# Patient Record
Sex: Male | Born: 1960 | Race: White | Hispanic: No | Marital: Married | Smoking: Never smoker
Health system: Southern US, Community
[De-identification: ages and names within clinical notes are randomized; demographics above are authoritative.]

## PROBLEM LIST (undated history)

## (undated) DIAGNOSIS — K219 Gastro-esophageal reflux disease without esophagitis: Secondary | ICD-10-CM

## (undated) DIAGNOSIS — R079 Chest pain, unspecified: Secondary | ICD-10-CM

## (undated) DIAGNOSIS — G4700 Insomnia, unspecified: Secondary | ICD-10-CM

## (undated) DIAGNOSIS — I2510 Atherosclerotic heart disease of native coronary artery without angina pectoris: Secondary | ICD-10-CM

## (undated) DIAGNOSIS — E785 Hyperlipidemia, unspecified: Secondary | ICD-10-CM

## (undated) DIAGNOSIS — Z7982 Long term (current) use of aspirin: Secondary | ICD-10-CM

## (undated) DIAGNOSIS — Z8679 Personal history of other diseases of the circulatory system: Secondary | ICD-10-CM

## (undated) DIAGNOSIS — I209 Angina pectoris, unspecified: Secondary | ICD-10-CM

## (undated) DIAGNOSIS — M109 Gout, unspecified: Secondary | ICD-10-CM

## (undated) DIAGNOSIS — Q245 Malformation of coronary vessels: Secondary | ICD-10-CM

## (undated) DIAGNOSIS — I10 Essential (primary) hypertension: Secondary | ICD-10-CM

## (undated) DIAGNOSIS — K5792 Diverticulitis of intestine, part unspecified, without perforation or abscess without bleeding: Secondary | ICD-10-CM

## (undated) HISTORY — PX: KNEE ARTHROSCOPY: SHX127

## (undated) HISTORY — PX: TONSILLECTOMY: SUR1361

## (undated) HISTORY — PX: HERNIA REPAIR: SHX51

## (undated) HISTORY — PX: CHOLECYSTECTOMY: SHX55

## (undated) HISTORY — PX: VASECTOMY: SHX75

## (undated) HISTORY — PX: WRIST SURGERY: SHX841

---

## 2018-06-27 ENCOUNTER — Other Ambulatory Visit: Payer: Self-pay | Admitting: Orthopedic Surgery

## 2019-12-17 ENCOUNTER — Other Ambulatory Visit: Payer: Self-pay

## 2019-12-17 ENCOUNTER — Emergency Department: Payer: Commercial Managed Care - PPO

## 2019-12-17 ENCOUNTER — Emergency Department
Admission: EM | Admit: 2019-12-17 | Discharge: 2019-12-17 | Disposition: A | Discharge status: Home / Self Care | Payer: Commercial Managed Care - PPO | Attending: Emergency Medicine | Admitting: Emergency Medicine

## 2019-12-17 ENCOUNTER — Encounter: Payer: Self-pay | Admitting: Emergency Medicine

## 2019-12-17 DIAGNOSIS — M79601 Pain in right arm: Secondary | ICD-10-CM | POA: Diagnosis not present

## 2019-12-17 DIAGNOSIS — R0789 Other chest pain: Secondary | ICD-10-CM | POA: Diagnosis present

## 2019-12-17 DIAGNOSIS — R079 Chest pain, unspecified: Secondary | ICD-10-CM

## 2019-12-17 LAB — BASIC METABOLIC PANEL
Anion gap: 9 (ref 5–15)
BUN: 23 mg/dL — ABNORMAL HIGH (ref 6–20)
CO2: 24 mmol/L (ref 22–32)
Calcium: 8.9 mg/dL (ref 8.9–10.3)
Chloride: 106 mmol/L (ref 98–111)
Creatinine, Ser: 1.05 mg/dL (ref 0.61–1.24)
GFR calc Af Amer: >60 mL/min (ref >60)
GFR calc non Af Amer: >60 mL/min (ref >60)
Glucose, Bld: 96 mg/dL (ref 70–99)
Potassium: 3.8 mmol/L (ref 3.5–5.1)
Sodium: 139 mmol/L (ref 135–145)

## 2019-12-17 LAB — TROPONIN I (HIGH SENSITIVITY)
Troponin I (High Sensitivity): 3 ng/L (ref <18)
Troponin I (High Sensitivity): <2 ng/L (ref <18)

## 2019-12-17 LAB — CBC
HCT: 42.9 % (ref 39.0–52.0)
Hemoglobin: 15.3 g/dL (ref 13.0–17.0)
MCH: 30.8 pg (ref 26.0–34.0)
MCHC: 35.7 g/dL (ref 30.0–36.0)
MCV: 86.5 fL (ref 80.0–100.0)
Platelets: 187 10*3/uL (ref 150–400)
RBC: 4.96 MIL/uL (ref 4.22–5.81)
RDW: 12.8 % (ref 11.5–15.5)
WBC: 11.2 10*3/uL — ABNORMAL HIGH (ref 4.0–10.5)
nRBC: 0 % (ref 0.0–0.2)

## 2019-12-17 MED ORDER — KETOROLAC TROMETHAMINE 30 MG/ML IJ SOLN
30.0000 mg | Freq: Once | INTRAMUSCULAR | Status: AC
  Administered 2019-12-17: 30 mg via INTRAMUSCULAR
  Filled 2019-12-17: qty 1

## 2019-12-17 MED ORDER — SODIUM CHLORIDE 0.9% FLUSH: 3.0000 mL | Freq: Once | INTRAVENOUS | Status: DC

## 2019-12-17 NOTE — ED Notes (Signed)
Pt states that he has been having chest pains on and off for months- pt states it is on the right side and sometimes the pain goes down his right arm

## 2019-12-17 NOTE — ED Provider Notes (Signed)
[facility] Emergency Department Provider Note   ____________________________________________   First MD Initiated Contact with Patient 12/17/19 [phone]     (approximate)  I have reviewed the triage vital signs and the nursing notes.   HISTORY  Chief Complaint Chest Pain    HPI Erik Carpenter is a 59 y.o. male with no significant past medical history presents to the ED complaining of chest pain.  Patient reports that he has had intermittent pain in the right side of his chest off and on for the past couple of months.  It can come on at any time, including rest, and is described as sharp and severe.  It will last for about 30 minutes at a time and can radiate down his right arm.  He has not been evaluated for the symptoms previously, but when the symptoms persisted last night much longer than they usually do, he decided to be evaluated.  He denies any fevers, cough, shortness of breath, or swelling in his arms or legs.  He denies any cardiac history and does not take any medications on a regular basis.  He has not had any nausea or vomiting with these episodes.        History reviewed. No pertinent past medical history.  There are no problems to display for this patient.   Past Surgical History:  Procedure Laterality Date  . TONSILLECTOMY    . VASECTOMY      Prior to Admission medications   Not on File    Allergies Oxycodone  History reviewed. No pertinent family history.  Social History Social History   Tobacco Use  . Smoking status: Never Smoker  . Smokeless tobacco: Never Used  Substance Use Topics  . Alcohol use: Not on file  . Drug use: Not on file    Review of Systems  Constitutional: No fever/chills Eyes: No visual changes. ENT: No sore throat. Cardiovascular: Positive for chest pain. Respiratory: Denies shortness of breath. Gastrointestinal: No abdominal pain.  No nausea, no vomiting.  No diarrhea.  No  constipation. Genitourinary: Negative for dysuria. Musculoskeletal: Negative for back pain.  Positive for right arm pain. Skin: Negative for rash. Neurological: Negative for headaches, focal weakness or numbness.  ____________________________________________   PHYSICAL EXAM:  VITAL SIGNS: ED Triage Vitals  Enc Vitals Group     BP 12/17/19 0946 105/72     Pulse Rate 12/17/19 0946 64     Resp 12/17/19 0946 18     Temp 12/17/19 0946 98.7 F (37.1 C)     Temp Source 12/17/19 0946 Oral     SpO2 12/17/19 0946 97 %     Weight 12/17/19 0940 175 lb (79.4 kg)     Height 12/17/19 0940 5\' 7"  (1.702 m)     Head Circumference --      Peak Flow --      Pain Score 12/17/19 0940 2     Pain Loc --      Pain Edu? --      Excl. in GC? --     Constitutional: Alert and oriented. Eyes: Conjunctivae are normal. Head: Atraumatic. Nose: No congestion/rhinnorhea. Mouth/Throat: Mucous membranes are moist. Neck: Normal ROM Cardiovascular: Normal rate, regular rhythm. Grossly normal heart sounds.  2+ radial pulses bilaterally. Respiratory: Normal respiratory effort.  No retractions. Lungs CTAB.  No chest wall tenderness. Gastrointestinal: Soft and nontender. No distention. Genitourinary: deferred Musculoskeletal: No lower extremity tenderness nor edema. Neurologic:  Normal speech and language. No gross focal neurologic  deficits are appreciated. Skin:  Skin is warm, dry and intact. No rash noted. Psychiatric: Mood and affect are normal. Speech and behavior are normal.  ____________________________________________   LABS (all labs ordered are listed, but only abnormal results are displayed)  Labs Reviewed  BASIC METABOLIC PANEL - Abnormal; Notable for the following components:      Result Value   BUN 23 (*)    All other components within normal limits  CBC - Abnormal; Notable for the following components:   WBC 11.2 (*)    All other components within normal limits  TROPONIN I (HIGH  SENSITIVITY)  TROPONIN I (HIGH SENSITIVITY)   ____________________________________________  EKG  ED ECG REPORT I, Blake Divine, the attending physician, personally viewed and interpreted this ECG.   Date: 12/17/2019  EKG Time: 9:44  Rate: 74  Rhythm: normal sinus rhythm  Axis: Normal  Intervals:none  ST&T Change: None   PROCEDURES  Procedure(s) performed (including Critical Care):  Procedures   ____________________________________________   INITIAL IMPRESSION / ASSESSMENT AND PLAN / ED COURSE       59 year old male with no significant past Thelma Barge presents to the ED with intermittent right-sided chest pain for the past couple of months that often radiates down his right arm.  He had a longer lasting episode overnight that has persisted into today with pain primarily present along the medial portion of his right upper arm at this time.  Radial pulses are intact bilaterally and I doubt arterial insufficiency, there is no edema or erythema to suggest DVT or infectious process.  EKG shows no evidence of arrhythmia or ischemia, low suspicion for ACS given atypical symptoms and if 2 sets of troponin are negative he would be appropriate for discharge home with PCP follow-up.  I doubt PE given pain is primarily in his right arm at this time.  2 sets of troponin are negative and patient with some improvement in pain following dose of Toradol.  He is appropriate for discharge home with PCP follow-up, was counseled to return to the ED for new worsening symptoms.  Patient agrees with plan.      ____________________________________________   FINAL CLINICAL IMPRESSION(S) / ED DIAGNOSES  Final diagnoses:  Nonspecific chest pain  Right arm pain     ED Discharge Orders    None       Note:  This document was prepared using Dragon voice recognition software and may include unintentional dictation errors.   Blake Divine, MD 12/17/19 1246

## 2019-12-17 NOTE — ED Triage Notes (Signed)
Pt to ER with c/o right sided chest pain radiating into right arm that started last night.  Pt states has had similar episodes briefly over last month.  Pt describes as aching in chest with pain in arm being sharp.  Pt states nausea, no vomiting.  Pt states SHOB on exertion.

## 2020-11-12 ENCOUNTER — Ambulatory Visit (INDEPENDENT_AMBULATORY_CARE_PROVIDER_SITE_OTHER): Payer: Commercial Managed Care - PPO

## 2020-11-12 ENCOUNTER — Other Ambulatory Visit: Payer: Self-pay

## 2020-11-12 ENCOUNTER — Ambulatory Visit (INDEPENDENT_AMBULATORY_CARE_PROVIDER_SITE_OTHER): Payer: Commercial Managed Care - PPO | Admitting: Podiatry

## 2020-11-12 DIAGNOSIS — M109 Gout, unspecified: Secondary | ICD-10-CM

## 2020-11-12 DIAGNOSIS — M7752 Other enthesopathy of left foot: Secondary | ICD-10-CM

## 2020-11-12 MED ORDER — COLCHICINE 0.6 MG PO TABS: 0.6000 mg | ORAL_TABLET | Freq: Every day | ORAL | 0 refills | Status: DC

## 2020-11-12 MED ORDER — BETAMETHASONE SOD PHOS & ACET 6 (3-3) MG/ML IJ SUSP
3.0000 mg | Freq: Once | INTRAMUSCULAR | Status: AC
  Administered 2020-11-12: 3 mg via INTRA_ARTICULAR

## 2020-11-12 NOTE — Progress Notes (Signed)
   HPI: 60 y.o. male presenting today as a new patient for evaluation of left foot pain.  Patient states that in the past he has been diagnosed with gout.  His last episode of gout was over 1 year ago.  He states that over the past week his left foot has become red and swollen and very painful.  He denies a history of trauma.  Acute onset.  He believes it is suspected gout.  He presents for further treatment and evaluation  No past medical history on file.   Physical Exam: General: The patient is alert and oriented x3 in no acute distress.  Dermatology: Skin is warm, dry and supple bilateral lower extremities. Negative for open lesions or macerations.  Vascular: Palpable pedal pulses bilaterally.  Localized erythema and edema noted to the first MTPJ left. Capillary refill within normal limits.  Neurological: Epicritic and protective threshold grossly intact bilaterally.   Musculoskeletal Exam: Range of motion within normal limits to all pedal and ankle joints bilateral. Muscle strength 5/5 in all groups bilateral.  Erythema with edema and exquisite tenderness to light palpation to the first MTPJ left foot consistent with findings of gout  Radiographic Exam:  Normal osseous mineralization. Joint spaces preserved. No fracture/dislocation/boney destruction.    Assessment: 1.  Acute gout/capsulitis first MTPJ left   Plan of Care:  1. Patient evaluated. X-Rays reviewed.  2.  Injection of 0.5 cc Celestone Soluspan injection the first MTPJ left 3.  Prescription for colchicine 0.6 mg #30 daily 4.  Recommend researching foods to avoid that could exacerbate his gout episodes 5.  Return to clinic as needed      Felecia Shelling, DPM [facility]  Dr. Felecia Shelling, DPM    [address] [place], [place] [address]                Office [phone]  Fax [phone]

## 2021-09-30 IMAGING — CR DG CHEST 2V
1 series · 2 of 2 positions shown · non-contrast
Comparison: None.

CLINICAL DATA: Chest pain

EXAM:
CHEST - 2 VIEW

[Series 1: dg chest 2 view · 0.14mm/px · 2 of 2 slices shown]
[im 1/2]
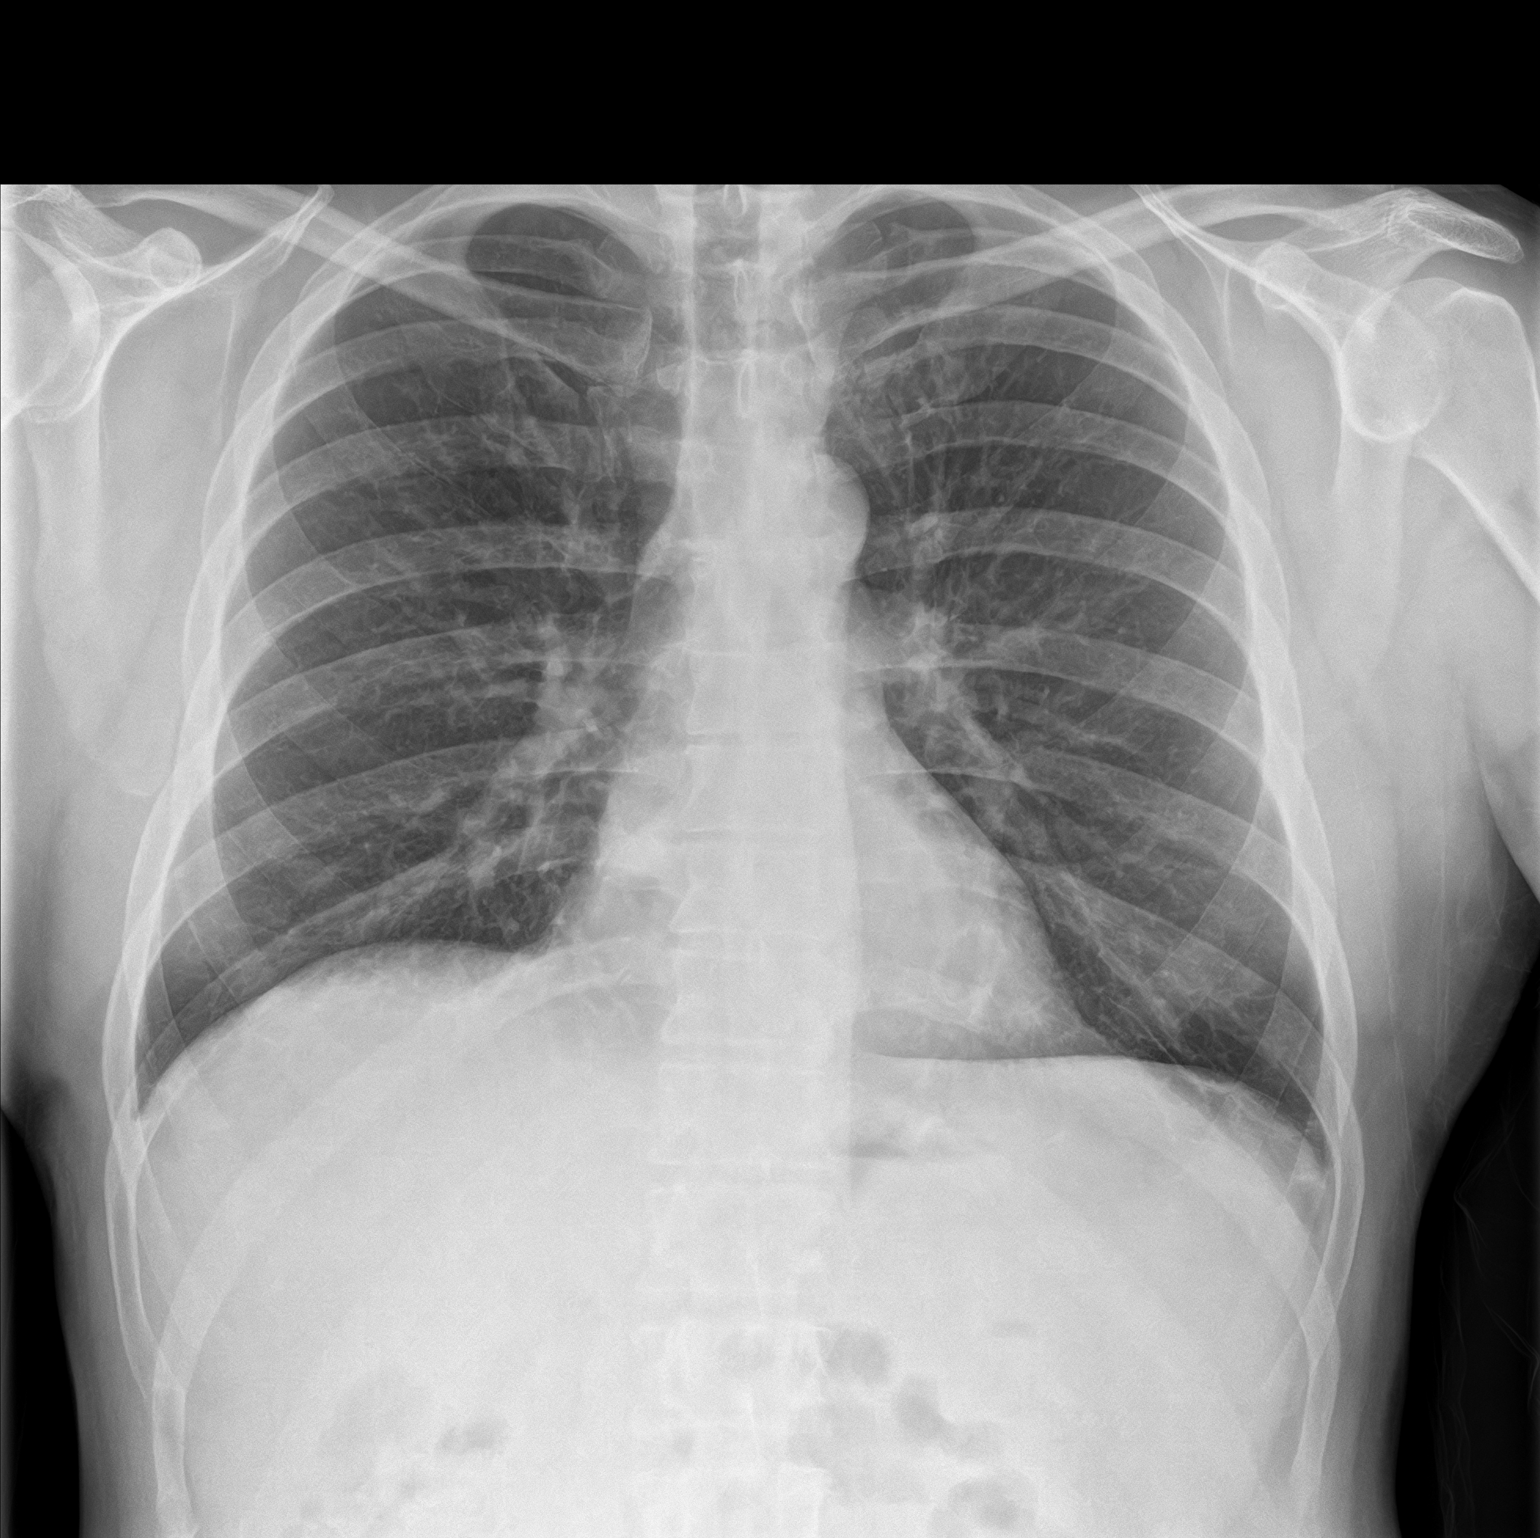
[im 2/2]
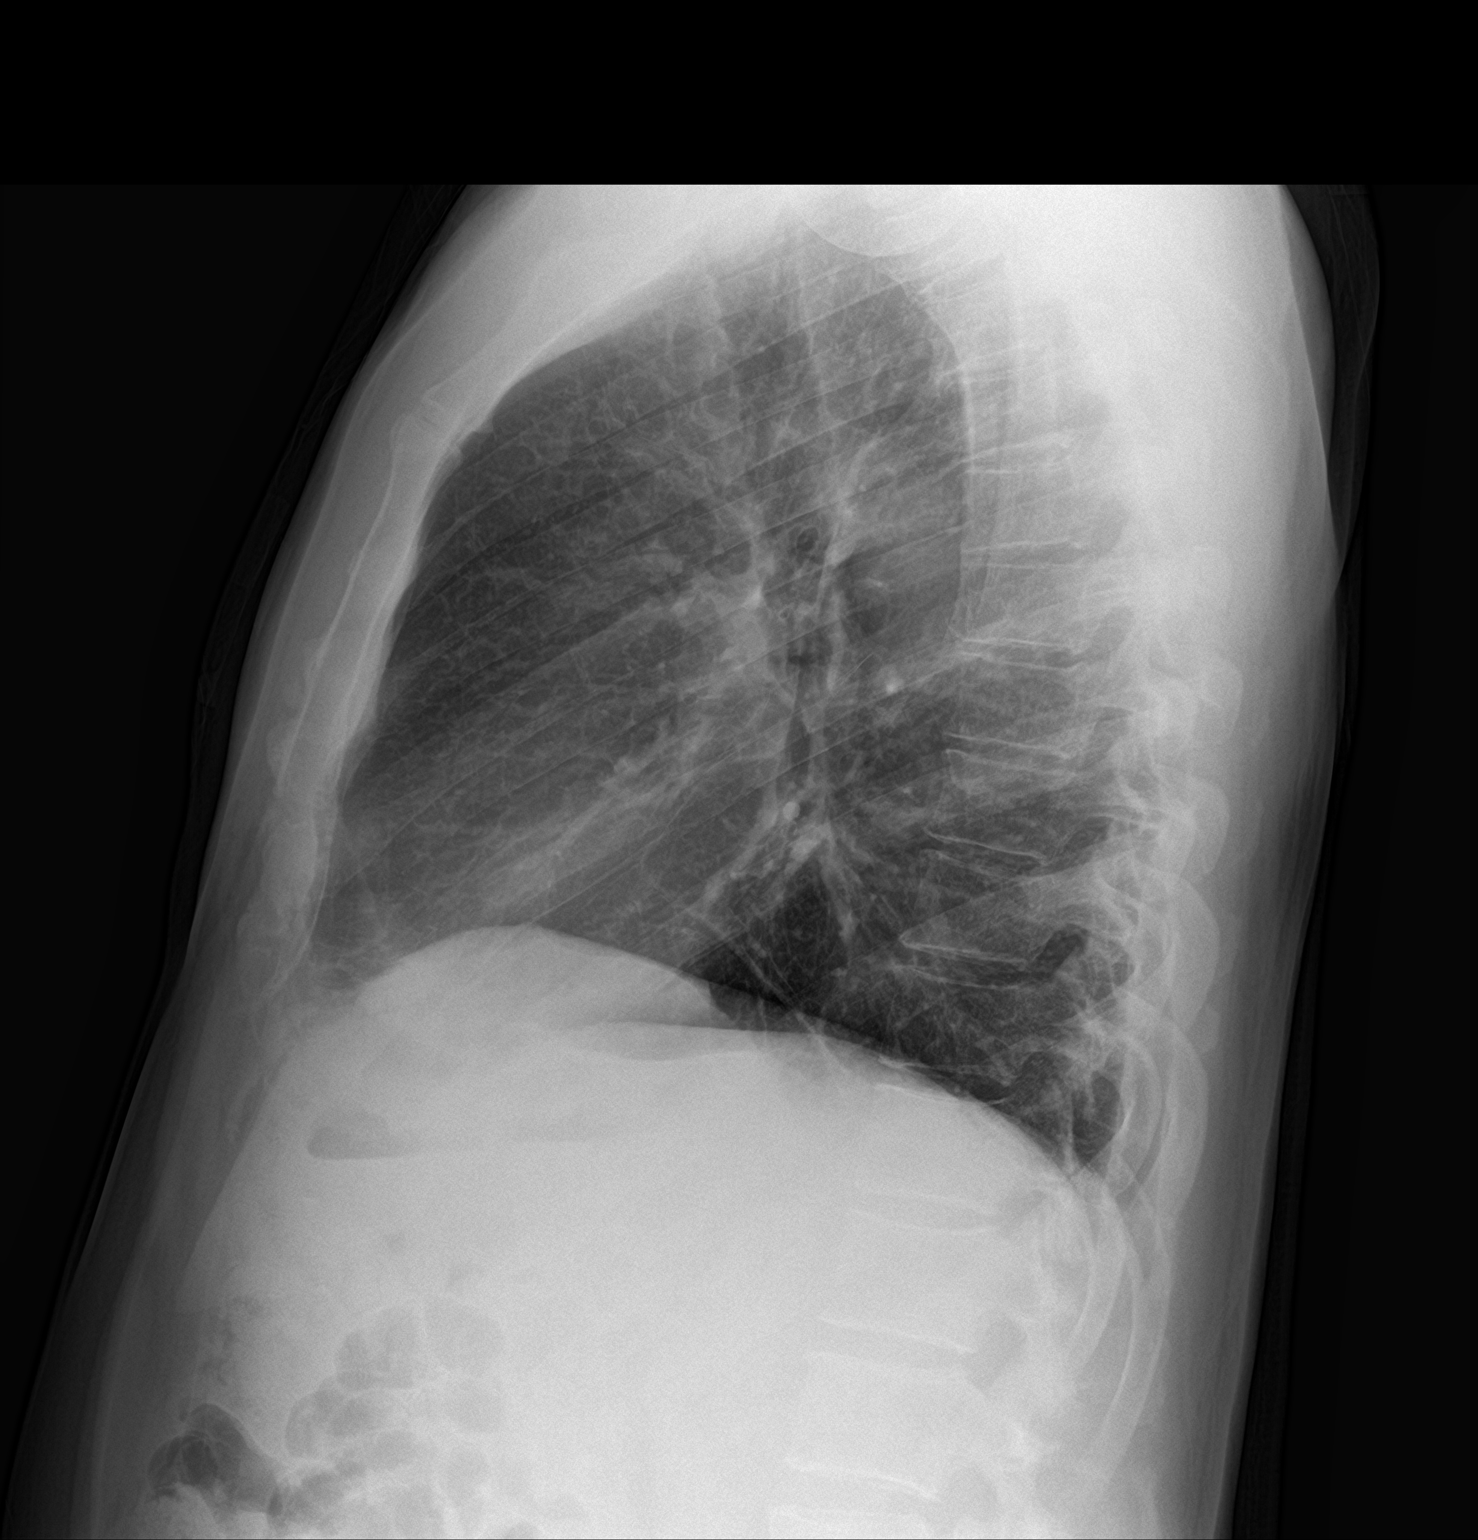

[2 of 2 positions shown; findings below may reference images not displayed]

FINDINGS: Lungs are clear. Heart size and pulmonary vascularity are normal. No
adenopathy. No bone lesions. No pneumothorax.
IMPRESSION: No abnormality noted.

## 2023-08-20 ENCOUNTER — Other Ambulatory Visit: Payer: Self-pay

## 2023-08-20 ENCOUNTER — Emergency Department: Payer: Commercial Managed Care - PPO

## 2023-08-20 ENCOUNTER — Emergency Department
Admission: EM | Admit: 2023-08-20 | Discharge: 2023-08-20 | Disposition: A | Discharge status: Home / Self Care | Payer: Commercial Managed Care - PPO | Attending: Emergency Medicine | Admitting: Emergency Medicine

## 2023-08-20 DIAGNOSIS — R079 Chest pain, unspecified: Secondary | ICD-10-CM

## 2023-08-20 DIAGNOSIS — R0789 Other chest pain: Secondary | ICD-10-CM | POA: Insufficient documentation

## 2023-08-20 LAB — BASIC METABOLIC PANEL
Anion gap: 11 (ref 5–15)
BUN: 23 mg/dL — ABNORMAL HIGH (ref 6–20)
CO2: 25 mmol/L (ref 22–32)
Calcium: 9.6 mg/dL (ref 8.9–10.3)
Chloride: 103 mmol/L (ref 98–111)
Creatinine, Ser: 1.29 mg/dL — ABNORMAL HIGH (ref 0.61–1.24)
GFR, Estimated: >60 mL/min (ref >60)
Glucose, Bld: 92 mg/dL (ref 70–99)
Potassium: 3.9 mmol/L (ref 3.5–5.1)
Sodium: 139 mmol/L (ref 135–145)

## 2023-08-20 LAB — CBC
HCT: 47.5 % (ref 39.0–52.0)
Hemoglobin: 16.1 g/dL (ref 13.0–17.0)
MCH: 30.7 pg (ref 26.0–34.0)
MCHC: 33.9 g/dL (ref 30.0–36.0)
MCV: 90.6 fL (ref 80.0–100.0)
Platelets: 187 10*3/uL (ref 150–400)
RBC: 5.24 MIL/uL (ref 4.22–5.81)
RDW: 12.7 % (ref 11.5–15.5)
WBC: 7.2 10*3/uL (ref 4.0–10.5)
nRBC: 0 % (ref 0.0–0.2)

## 2023-08-20 LAB — TROPONIN I (HIGH SENSITIVITY)
Troponin I (High Sensitivity): 3 ng/L (ref <18)
Troponin I (High Sensitivity): 3 ng/L (ref <18)

## 2023-08-20 MED ORDER — IOHEXOL 350 MG/ML SOLN
75.0000 mL | Freq: Once | INTRAVENOUS | Status: AC | PRN
  Administered 2023-08-20: 75 mL via INTRAVENOUS

## 2023-08-20 NOTE — ED Triage Notes (Signed)
Pt sts that he is having center chest pain that radiates to the right arm and down it. Pt denies any N/V.

## 2023-08-20 NOTE — ED Provider Notes (Signed)
[facility] Provider Note    Event Date/Time   First MD Initiated Contact with Patient 08/20/23 1101     (approximate)   History   Chest Pain   HPI  Erik Carpenter is a 63 y.o. male with no significant past medical history who presents with complaints of chest pain.  Patient reports intermittent chest pain over the last couple of weeks, he reports he went to see his doctor who sent him to the emergency department.  He reports radiation into his right chest at times, no significant shortness of breath although possibly some pleurisy.  No fevers chills or cough.  No calf pain or swelling.     Physical Exam   Triage Vital Signs: ED Triage Vitals  Encounter Vitals Group     BP 08/20/23 1037 119/89     Systolic BP Percentile --      Diastolic BP Percentile --      Pulse Rate 08/20/23 1037 60     Resp 08/20/23 1037 19     Temp 08/20/23 1037 98.1 F (36.7 C)     Temp Source 08/20/23 1037 Oral     SpO2 08/20/23 1037 97 %     Weight 08/20/23 1036 81.6 kg (180 lb)     Height 08/20/23 1036 1.702 m (5\' 7" )     Head Circumference --      Peak Flow --      Pain Score 08/20/23 1036 2     Pain Loc --      Pain Education --      Exclude from Growth Chart --     Most recent vital signs: Vitals:   08/20/23 1037 08/20/23 1600  BP: 119/89 114/67  Pulse: 60 64  Resp: 19 11  Temp: 98.1 F (36.7 C) 98.2 F (36.8 C)  SpO2: 97% 99%     General: Awake, no distress.  CV:  Good peripheral perfusion.  Resp:  Normal effort.  Clear to auscultation bilaterally Abd:  No distention.  Other:  No calf pain or swelling   ED Results / Procedures / Treatments   Labs (all labs ordered are listed, but only abnormal results are displayed) Labs Reviewed  BASIC METABOLIC PANEL - Abnormal; Notable for the following components:      Result Value   BUN 23 (*)    Creatinine, Ser 1.29 (*)    All other components within normal limits  CBC  TROPONIN I (HIGH SENSITIVITY)   TROPONIN I (HIGH SENSITIVITY)     EKG  ED ECG REPORT I, Jene Every, the attending physician, personally viewed and interpreted this ECG.  Date: 08/20/2023  Rhythm: normal sinus rhythm QRS Axis: normal Intervals: normal ST/T Wave abnormalities: normal Narrative Interpretation: no evidence of acute ischemia    RADIOLOGY Chest x-ray viewed interpret by me, no acute abnormality    PROCEDURES:  Critical Care performed:   Procedures   MEDICATIONS ORDERED IN ED: Medications  iohexol (OMNIPAQUE) 350 MG/ML injection 75 mL (75 mLs Intravenous Contrast Given 08/20/23 1333)     IMPRESSION / MDM / ASSESSMENT AND PLAN / ED COURSE  I reviewed the triage vital signs and the nursing notes. Patient's presentation is most consistent with acute presentation with potential threat to life or bodily function.  Patient presents with chest pain as detailed above, differential includes angina less likely ACS given reassuring EKG, PE  High sensitive troponin is reassuring, chest x-ray is unremarkable, will send for CT angiography  Have asked  my colleague to follow-up on CT angiography results, anticipate discharge if reassuring        FINAL CLINICAL IMPRESSION(S) / ED DIAGNOSES   Final diagnoses:  Chest pain, unspecified type     Rx / DC Orders   ED Discharge Orders     None        Note:  This document was prepared using Dragon voice recognition software and may include unintentional dictation errors.   Jene Every, MD 08/21/23 [phone]

## 2023-08-20 NOTE — ED Provider Notes (Signed)
-----------------------------------------   4:20 PM on 08/20/2023 ----------------------------------------- Patient care assumed from Dr. Cyril Loosen.  Patient's workup is reassuring.  Lab work reassuring clued a normal CBC chemistry negative troponin x 2.  Patient CTA has resulted negative for PE no significant acute finding.  Given the patient's reassuring workup we will discharge patient with outpatient follow-up.  Patient agreeable to plan.   Minna Antis, MD 08/20/23 1620

## 2023-08-20 NOTE — ED Notes (Signed)
Patient discharged from ED by provider. Discharge instructions reviewed with patient and all questions answered. Patient ambulatory from ED in NAD.

## 2023-08-20 NOTE — Discharge Instructions (Addendum)
Your workup today in the emergency department is reassuring including a normal CT scan of your chest.  Please follow-up with your doctor the next several days for recheck/reevaluation.

## 2023-09-04 HISTORY — PX: LEFT HEART CATH AND CORONARY ANGIOGRAPHY: CATH118249

## 2024-02-08 ENCOUNTER — Encounter: Payer: Self-pay | Admitting: Surgery

## 2024-02-08 ENCOUNTER — Emergency Department

## 2024-02-08 ENCOUNTER — Inpatient Hospital Stay
Admission: EM | Admit: 2024-02-08 | Discharge: 2024-02-14 | DRG: 392 | Disposition: A | Discharge status: Home / Self Care | Attending: Surgery | Admitting: Surgery

## 2024-02-08 ENCOUNTER — Other Ambulatory Visit: Payer: Self-pay

## 2024-02-08 DIAGNOSIS — M109 Gout, unspecified: Secondary | ICD-10-CM | POA: Diagnosis present

## 2024-02-08 DIAGNOSIS — K5792 Diverticulitis of intestine, part unspecified, without perforation or abscess without bleeding: Principal | ICD-10-CM | POA: Diagnosis present

## 2024-02-08 DIAGNOSIS — Z79899 Other long term (current) drug therapy: Secondary | ICD-10-CM

## 2024-02-08 DIAGNOSIS — M25572 Pain in left ankle and joints of left foot: Secondary | ICD-10-CM | POA: Diagnosis present

## 2024-02-08 DIAGNOSIS — Z9049 Acquired absence of other specified parts of digestive tract: Secondary | ICD-10-CM | POA: Diagnosis not present

## 2024-02-08 DIAGNOSIS — K5720 Diverticulitis of large intestine with perforation and abscess without bleeding: Principal | ICD-10-CM | POA: Diagnosis present

## 2024-02-08 DIAGNOSIS — R339 Retention of urine, unspecified: Secondary | ICD-10-CM | POA: Diagnosis present

## 2024-02-08 DIAGNOSIS — R112 Nausea with vomiting, unspecified: Secondary | ICD-10-CM | POA: Diagnosis present

## 2024-02-08 DIAGNOSIS — Z885 Allergy status to narcotic agent status: Secondary | ICD-10-CM

## 2024-02-08 DIAGNOSIS — R102 Pelvic and perineal pain: Secondary | ICD-10-CM | POA: Diagnosis present

## 2024-02-08 DIAGNOSIS — R7989 Other specified abnormal findings of blood chemistry: Secondary | ICD-10-CM | POA: Diagnosis present

## 2024-02-08 DIAGNOSIS — R3916 Straining to void: Secondary | ICD-10-CM | POA: Diagnosis present

## 2024-02-08 LAB — CBC
HCT: 41.9 % (ref 39.0–52.0)
Hemoglobin: 14.5 g/dL (ref 13.0–17.0)
MCH: 30.7 pg (ref 26.0–34.0)
MCHC: 34.6 g/dL (ref 30.0–36.0)
MCV: 88.8 fL (ref 80.0–100.0)
Platelets: 202 K/uL (ref 150–400)
RBC: 4.72 MIL/uL (ref 4.22–5.81)
RDW: 13.2 % (ref 11.5–15.5)
WBC: 16.5 K/uL — ABNORMAL HIGH (ref 4.0–10.5)
nRBC: 0 % (ref 0.0–0.2)

## 2024-02-08 LAB — RESP PANEL BY RT-PCR (RSV, FLU A&B, COVID)  RVPGX2
Influenza A by PCR: NEGATIVE
Influenza B by PCR: NEGATIVE
Resp Syncytial Virus by PCR: NEGATIVE
SARS Coronavirus 2 by RT PCR: NEGATIVE

## 2024-02-08 LAB — COMPREHENSIVE METABOLIC PANEL WITH GFR
ALT: 38 U/L (ref 0–44)
AST: 40 U/L (ref 15–41)
Albumin: 4 g/dL (ref 3.5–5.0)
Alkaline Phosphatase: 81 U/L (ref 38–126)
Anion gap: 10 (ref 5–15)
BUN: 24 mg/dL — ABNORMAL HIGH (ref 6–20)
CO2: 26 mmol/L (ref 22–32)
Calcium: 9.3 mg/dL (ref 8.9–10.3)
Chloride: 101 mmol/L (ref 98–111)
Creatinine, Ser: 1.54 mg/dL — ABNORMAL HIGH (ref 0.61–1.24)
GFR, Estimated: 51 mL/min — ABNORMAL LOW (ref >60)
Glucose, Bld: 130 mg/dL — ABNORMAL HIGH (ref 70–99)
Potassium: 4.1 mmol/L (ref 3.5–5.1)
Sodium: 137 mmol/L (ref 135–145)
Total Bilirubin: 1.9 mg/dL — ABNORMAL HIGH (ref 0.0–1.2)
Total Protein: 6.9 g/dL (ref 6.5–8.1)

## 2024-02-08 LAB — URINALYSIS, ROUTINE W REFLEX MICROSCOPIC
Bacteria, UA: NONE SEEN
Bilirubin Urine: NEGATIVE
Glucose, UA: NEGATIVE mg/dL
Hgb urine dipstick: NEGATIVE
Ketones, ur: 5 mg/dL — AB
Leukocytes,Ua: NEGATIVE
Nitrite: NEGATIVE
Protein, ur: 30 mg/dL — AB
Specific Gravity, Urine: 1.028 (ref 1.005–1.030)
pH: 6 (ref 5.0–8.0)

## 2024-02-08 LAB — LIPASE, BLOOD: Lipase: 37 U/L (ref 11–51)

## 2024-02-08 LAB — HIV ANTIBODY (ROUTINE TESTING W REFLEX): HIV Screen 4th Generation wRfx: NONREACTIVE

## 2024-02-08 MED ORDER — ONDANSETRON 4 MG PO TBDP: 4.0000 mg | ORAL_TABLET | Freq: Four times a day (QID) | ORAL | Status: DC | PRN

## 2024-02-08 MED ORDER — DILTIAZEM HCL ER 120 MG PO TB24
120.0000 mg | ORAL_TABLET | Freq: Every day | ORAL | Status: DC
  Administered 2024-02-08 – 2024-02-14 (×7): 120 mg via ORAL
  Filled 2024-02-08 (×7): qty 1

## 2024-02-08 MED ORDER — IOHEXOL 300 MG/ML  SOLN
100.0000 mL | Freq: Once | INTRAMUSCULAR | Status: AC | PRN
  Administered 2024-02-08: 100 mL via INTRAVENOUS

## 2024-02-08 MED ORDER — ONDANSETRON HCL 4 MG/2ML IJ SOLN
4.0000 mg | Freq: Four times a day (QID) | INTRAMUSCULAR | Status: DC | PRN
  Administered 2024-02-11: 4 mg via INTRAVENOUS
  Filled 2024-02-08: qty 2

## 2024-02-08 MED ORDER — CIPROFLOXACIN IN D5W 400 MG/200ML IV SOLN
400.0000 mg | Freq: Once | INTRAVENOUS | Status: DC
  Filled 2024-02-08: qty 200

## 2024-02-08 MED ORDER — HYDROCODONE-ACETAMINOPHEN 5-325 MG PO TABS: 1.0000 | ORAL_TABLET | Freq: Three times a day (TID) | ORAL | Status: DC | PRN

## 2024-02-08 MED ORDER — SODIUM CHLORIDE 0.9 % IV SOLN: INTRAVENOUS | Status: AC

## 2024-02-08 MED ORDER — TERBINAFINE HCL 250 MG PO TABS: 250.0000 mg | ORAL_TABLET | Freq: Every day | ORAL | Status: DC

## 2024-02-08 MED ORDER — ATORVASTATIN CALCIUM 20 MG PO TABS
20.0000 mg | ORAL_TABLET | Freq: Every day | ORAL | Status: DC
  Administered 2024-02-08 – 2024-02-13 (×6): 20 mg via ORAL
  Filled 2024-02-08 (×6): qty 1

## 2024-02-08 MED ORDER — METRONIDAZOLE 500 MG/100ML IV SOLN
500.0000 mg | Freq: Once | INTRAVENOUS | Status: AC
  Administered 2024-02-08: 500 mg via INTRAVENOUS
  Filled 2024-02-08: qty 100

## 2024-02-08 MED ORDER — ENOXAPARIN SODIUM 40 MG/0.4ML IJ SOSY
40.0000 mg | PREFILLED_SYRINGE | Freq: Every day | INTRAMUSCULAR | Status: DC
  Administered 2024-02-08 – 2024-02-13 (×6): 40 mg via SUBCUTANEOUS
  Filled 2024-02-08 (×6): qty 0.4

## 2024-02-08 MED ORDER — ONDANSETRON HCL 4 MG/2ML IJ SOLN
4.0000 mg | Freq: Once | INTRAMUSCULAR | Status: AC
  Administered 2024-02-08: 4 mg via INTRAVENOUS
  Filled 2024-02-08: qty 2

## 2024-02-08 MED ORDER — METRONIDAZOLE 500 MG/100ML IV SOLN
500.0000 mg | Freq: Two times a day (BID) | INTRAVENOUS | Status: DC
  Administered 2024-02-08 – 2024-02-14 (×13): 500 mg via INTRAVENOUS
  Filled 2024-02-08 (×12): qty 100

## 2024-02-08 MED ORDER — TRAMADOL HCL 50 MG PO TABS
50.0000 mg | ORAL_TABLET | Freq: Four times a day (QID) | ORAL | Status: DC | PRN
  Administered 2024-02-09 – 2024-02-14 (×5): 50 mg via ORAL
  Filled 2024-02-08 (×4): qty 1

## 2024-02-08 MED ORDER — TRAZODONE HCL 100 MG PO TABS
100.0000 mg | ORAL_TABLET | Freq: Every day | ORAL | Status: DC
  Administered 2024-02-08 – 2024-02-13 (×6): 100 mg via ORAL
  Filled 2024-02-08 (×6): qty 1

## 2024-02-08 MED ORDER — ALLOPURINOL 100 MG PO TABS
200.0000 mg | ORAL_TABLET | Freq: Every day | ORAL | Status: DC
  Administered 2024-02-08 – 2024-02-14 (×8): 200 mg via ORAL
  Filled 2024-02-08 (×7): qty 2

## 2024-02-08 MED ORDER — ACETAMINOPHEN 325 MG PO TABS
650.0000 mg | ORAL_TABLET | Freq: Three times a day (TID) | ORAL | Status: DC | PRN
  Administered 2024-02-08 – 2024-02-10 (×4): 650 mg via ORAL
  Filled 2024-02-08 (×4): qty 2

## 2024-02-08 MED ORDER — DOCUSATE SODIUM 100 MG PO CAPS: 100.0000 mg | ORAL_CAPSULE | Freq: Two times a day (BID) | ORAL | Status: DC | PRN

## 2024-02-08 MED ORDER — CIPROFLOXACIN IN D5W 400 MG/200ML IV SOLN
400.0000 mg | Freq: Two times a day (BID) | INTRAVENOUS | Status: DC
  Administered 2024-02-08 – 2024-02-14 (×13): 400 mg via INTRAVENOUS
  Filled 2024-02-08 (×12): qty 200

## 2024-02-08 MED ORDER — MORPHINE SULFATE (PF) 4 MG/ML IV SOLN
4.0000 mg | Freq: Once | INTRAVENOUS | Status: AC
  Administered 2024-02-08: 4 mg via INTRAVENOUS
  Filled 2024-02-08: qty 1

## 2024-02-08 MED ORDER — ASPIRIN 81 MG PO TBEC: 81.0000 mg | DELAYED_RELEASE_TABLET | Freq: Once | ORAL | Status: DC

## 2024-02-08 MED ORDER — MORPHINE SULFATE (PF) 2 MG/ML IV SOLN
2.0000 mg | INTRAVENOUS | Status: DC | PRN
  Administered 2024-02-08: 2 mg via INTRAVENOUS
  Filled 2024-02-08: qty 1

## 2024-02-08 NOTE — ED Provider Notes (Addendum)
 [facility] Provider Note    Event Date/Time   First MD Initiated Contact with Patient 02/08/24 1018     (approximate)   History   Abdominal Pain   HPI  Erik Carpenter is a 63 y.o. male history of back pain presents emergency department complaining of lower abdominal pain that started overnight..  States been severe.  Last time he was able to urinate without difficulty was at 4 PM yesterday.  States has a lot of swelling and pressure in the lower abdomen and into his testicles and penis.  States he had fever and chills last night.  Severe pain today.  Only gets dribbles of urine out when he tries to urinate.  States had loose stools yesterday.  Couple episodes of vomiting last night      Physical Exam   Triage Vital Signs: ED Triage Vitals  Encounter Vitals Group     BP 02/08/24 0924 99/65     Girls Systolic BP Percentile --      Girls Diastolic BP Percentile --      Boys Systolic BP Percentile --      Boys Diastolic BP Percentile --      Pulse Rate 02/08/24 0924 85     Resp 02/08/24 0924 16     Temp 02/08/24 0924 98.2 F (36.8 C)     Temp Source 02/08/24 0924 Oral     SpO2 02/08/24 0924 99 %     Weight 02/08/24 0925 168 lb (76.2 kg)     Height 02/08/24 0925 5' 7 (1.702 m)     Head Circumference --      Peak Flow --      Pain Score 02/08/24 0925 7     Pain Loc --      Pain Education --      Exclude from Growth Chart --     Most recent vital signs: Vitals:   02/08/24 0924  BP: 99/65  Pulse: 85  Resp: 16  Temp: 98.2 F (36.8 C)  SpO2: 99%     General: Awake, no distress.   CV:  Good peripheral perfusion.  Resp:  Normal effort.  Abd:  No distention.  Abdomen extremely tender at the pubis, super pubis, left lower quadrant and right lower quadrant, bowel sounds are present Other:      ED Results / Procedures / Treatments   Labs (all labs ordered are listed, but only abnormal results are displayed) Labs Reviewed  COMPREHENSIVE  METABOLIC PANEL WITH GFR - Abnormal; Notable for the following components:      Result Value   Glucose, Bld 130 (*)    BUN 24 (*)    Creatinine, Ser 1.54 (*)    Total Bilirubin 1.9 (*)    GFR, Estimated 51 (*)    All other components within normal limits  CBC - Abnormal; Notable for the following components:   WBC 16.5 (*)    All other components within normal limits  URINALYSIS, ROUTINE W REFLEX MICROSCOPIC - Abnormal; Notable for the following components:   Color, Urine AMBER (*)    APPearance CLEAR (*)    Ketones, ur 5 (*)    Protein, ur 30 (*)    All other components within normal limits  RESP PANEL BY RT-PCR (RSV, FLU A&B, COVID)  [id number]  CULTURE, BLOOD (ROUTINE X 2)  CULTURE, BLOOD (ROUTINE X 2)  LIPASE, BLOOD  HIV ANTIBODY (ROUTINE TESTING W REFLEX)     EKG  RADIOLOGY CT abdomen pelvis IV contrast    PROCEDURES:   .Critical Care  Performed by: Gasper Devere ORN, PA-C Authorized by: Gasper Devere ORN, PA-C   Critical care provider statement:    Critical care time (minutes):  30   Critical care time was exclusive of:  Separately billable procedures and treating other patients   Critical care was necessary to treat or prevent imminent or life-threatening deterioration of the following conditions:  Sepsis   Critical care was time spent personally by me on the following activities:  Development of treatment plan with patient or surrogate, discussions with consultants, evaluation of patient's response to treatment, examination of patient, ordering and review of laboratory studies, ordering and review of radiographic studies, ordering and performing treatments and interventions, pulse oximetry, re-evaluation of patient's condition and review of old charts   Critical Care:  no Chief Complaint  Patient presents with   Abdominal Pain      MEDICATIONS ORDERED IN ED: Medications  traZODone  (DESYREL ) tablet 100 mg (has no administration in time range)  aspirin  EC  tablet 81 mg (has no administration in time range)  atorvastatin  (LIPITOR) tablet 20 mg (has no administration in time range)  allopurinol  (ZYLOPRIM ) tablet 200 mg (has no administration in time range)  diltiazem  (DILACOR XR ) 24 hr capsule 120 mg (has no administration in time range)  acetaminophen  (TYLENOL ) tablet 650 mg (has no administration in time range)  traMADol  (ULTRAM ) tablet 50 mg (has no administration in time range)  HYDROcodone -acetaminophen  (NORCO/VICODIN) 5-325 MG per tablet 1 tablet (has no administration in time range)  morphine  (PF) 2 MG/ML injection 2 mg (has no administration in time range)  docusate sodium  (COLACE) capsule 100 mg (has no administration in time range)  ondansetron  (ZOFRAN -ODT) disintegrating tablet 4 mg (has no administration in time range)    Or  ondansetron  (ZOFRAN ) injection 4 mg (has no administration in time range)  enoxaparin  (LOVENOX ) injection 40 mg (has no administration in time range)  0.9 %  sodium chloride  infusion (has no administration in time range)  ciprofloxacin  (CIPRO ) IVPB 400 mg (has no administration in time range)    And  metroNIDAZOLE  (FLAGYL ) IVPB 500 mg (has no administration in time range)  morphine  (PF) 4 MG/ML injection 4 mg (4 mg Intravenous Given 02/08/24 1058)  ondansetron  (ZOFRAN ) injection 4 mg (4 mg Intravenous Given 02/08/24 1058)  iohexol  (OMNIPAQUE ) 300 MG/ML solution 100 mL (100 mLs Intravenous Contrast Given 02/08/24 1116)  metroNIDAZOLE  (FLAGYL ) IVPB 500 mg (0 mg Intravenous Stopped 02/08/24 1429)     IMPRESSION / MDM / ASSESSMENT AND PLAN / ED COURSE  I reviewed the triage vital signs and the nursing notes.                              Differential diagnosis includes, but is not limited to, urinary retention, UTI, pyelonephritis, acute diverticulitis, perforation diverticulum, bowel obstruction, prostatitis, acute appendicitis  Patient's presentation is most consistent with acute illness / injury with system  symptoms.    Medications given: Morphine  4 mg IV, Zofran  4 mg IV  Due to concerns of urinary retention due to the amount of swelling in the lower abdomen, had nursing staff do a bladder scan which only showed like 5 mL.  Instructed them to go ahead and insert Foley due to the patient's history.  We were able to slowly get 50 mL of urine out.  Have concerns that there is some sort  of obstruction  Patient has elevated WBC of 16.5, comprehensive metabolic panel basically reassuring, UA reassuring, to the patient's fever, tachycardia, and WBC elevation do feel he qualifies for sepsis.  CT abdomen pelvis IV contrast, independently reviewed interpreted by me as being positive for sigmoid diverticulitis with abscess  Consult to surgery, Dr. Tye to admit to his service.  Patient is in agreement with admission.  We did go ahead and start Cipro  and Flagyl .  He was admitted in stable condition.      FINAL CLINICAL IMPRESSION(S) / ED DIAGNOSES   Final diagnoses:  Acute diverticulitis     Rx / DC Orders   ED Discharge Orders     None        Note:  This document was prepared using Dragon voice recognition software and may include unintentional dictation errors.    Gasper Devere ORN, PA-C 02/08/24 1444    Gasper Devere ORN, PA-C 02/08/24 1444    Arlander Charleston, MD 02/08/24 [phone]

## 2024-02-08 NOTE — Plan of Care (Signed)
  Problem: Education: Goal: Knowledge of General Education information will improve Description: Including pain rating scale, medication(s)/side effects and non-pharmacologic comfort measures Outcome: Progressing   Problem: Clinical Measurements: Goal: Ability to maintain clinical measurements within normal limits will improve Outcome: Progressing   Problem: Activity: Goal: Risk for activity intolerance will decrease Outcome: Progressing   Problem: Elimination: Goal: Will not experience complications related to bowel motility Outcome: Progressing   Problem: Safety: Goal: Ability to remain free from injury will improve Outcome: Progressing   

## 2024-02-08 NOTE — ED Triage Notes (Signed)
 Pt states that he has been having lower abd pain since last pm, hurts to void, hurts in his penis and his anus, tender to touch

## 2024-02-08 NOTE — H&P (Signed)
 [facility]- General Surgery SURGICAL HISTORY & PHYSICAL   HISTORY OF PRESENT ILLNESS (HPI):  63 y.o. male presented to [facility] ED today for lower abdominal pain.Patient states abdominal pain started abruptly last night radiating to perianal and perineal area. Pain has been constant since last night.  Reports subjective fever and chills. He had a a few nonbloody emesis episodes last night. States he did not try anything to alleviate the pain. Pain noticed the pain is worse with movement and pressure.  Has had a hard time urinating. States he has not had a pain like this before. Has not noticed any changes in bowel habits.  Reports having regular bowel movements, no blood in stool.  With still feeling nauseous this morning but no vomiting. Prior abdominal surgeries include cholecystectomy and hernia repair through the [place].   In the ED, labs indicate leukocytosis with white blood count of 16.5.  Elevated creatinine of 1.54 and elevated total bilirubin of 1.9.  LFTs and alkaline phos were within normal limits.  No electrolyte disturbances noted. Lipase level 37.  CT of abdomen showed mild sigmoid colon diverticulosis with fluid collection measuring 1.1 cm.  Endings consistent with acute complicated diverticulitis.   PAST MEDICAL HISTORY (PMH):  No past medical history on file.  Reviewed. Otherwise negative.   PAST SURGICAL HISTORY (PSH):  Past Surgical History:  Procedure Laterality Date   TONSILLECTOMY     VASECTOMY      Reviewed. Otherwise negative.   MEDICATIONS:  Prior to Admission medications   Medication Sig Start Date End Date Taking? Authorizing Provider  allopurinol  (ZYLOPRIM ) 100 MG tablet Take 200 mg by mouth daily. 12/15/23  Yes [provider]  aspirin  EC 81 MG tablet Take 81 mg by mouth once. 12/15/23  Yes [provider]  atorvastatin  (LIPITOR) 20 MG tablet Take 20 mg by mouth.  TAKE ONE TABLET BY MOUTH AT BEDTIME FOR CHOLESTEROL 12/15/23  Yes [provider]  diltiazem  (DILACOR XR ) 120 MG 24 hr capsule Take 120 mg by mouth.  TAKE ONE CAPSULE BY MOUTH EVERY DAY THIS REPLACES METOPROLOL 12/15/23  Yes [provider]  terbinafine  (LAMISIL ) 250 MG tablet Take 250 mg by mouth.  TAKE ONE TABLET BY MOUTH EVERY DAY 12/15/23  Yes [provider]  traZODone  (DESYREL ) 100 MG tablet Take 100 mg by mouth.  TAKE ONE TABLET BY MOUTH AT BEDTIME FOR SLEEP 11/22/23  Yes [provider]     ALLERGIES:  Allergies  Allergen Reactions   Oxycodone Itching     SOCIAL HISTORY:  Social History   Socioeconomic History   Marital status: Married    Spouse name: Not on file   Number of children: Not on file   Years of education: Not on file   Highest education level: Not on file  Occupational History   Not on file  Tobacco Use   Smoking status: Never   Smokeless tobacco: Never  Substance and Sexual Activity   Alcohol use: Not on file   Drug use: Not on file   Sexual activity: Not on file  Other Topics Concern   Not on file  Social History Narrative   Not on file   Social Drivers of Health   Financial Resource Strain: Not on file  Food Insecurity: Not on file  Transportation Needs: Not on file  Physical Activity: Not on file  Stress: Not on file  Social Connections: Not on file  Intimate Partner Violence: Not on file  FAMILY HISTORY:  No family history on file.  Otherwise negative.   REVIEW OF SYSTEMS:  Review of Systems  Constitutional:  Negative for chills and fever.  Respiratory:  Negative for shortness of breath and wheezing.   Cardiovascular:  Negative for chest pain and palpitations.  Gastrointestinal:  Positive for abdominal pain and nausea. Negative for blood in stool and vomiting.    VITAL SIGNS:  Temp:  [98.2 F (36.8 C)] 98.2 F (36.8 C) (08/05 0924) Pulse Rate:  [85] 85 (08/05 0924) Resp:  [16] 16 (08/05 0924) BP: (99)/(65) 99/65 (08/05 0924) SpO2:  [99 %] 99 % (08/05 0924) Weight:  [76.2 kg]  76.2 kg (08/05 0925)     Height: 5' 7 (170.2 cm) (Simultaneous filing. User may not have seen previous data.) Weight: 76.2 kg (Simultaneous filing. User may not have seen previous data.) BMI (Calculated): 26.31   PHYSICAL EXAM:  Physical Exam Constitutional:      Appearance: He is well-developed.  HENT:     Head: Normocephalic and atraumatic.  Cardiovascular:     Rate and Rhythm: Normal rate and regular rhythm.  Pulmonary:     Effort: Pulmonary effort is normal.     Breath sounds: Normal breath sounds.  Abdominal:     General: Abdomen is flat. There is no distension.     Palpations: Abdomen is soft.     Tenderness: There is generalized abdominal tenderness.  Skin:    General: Skin is warm and dry.  Neurological:     Mental Status: He is alert.     INTAKE/OUTPUT:  This shift: No intake/output data recorded.  Last 2 shifts: @IOLAST2SHIFTS @  Labs:     Latest Ref Rng & Units 02/08/2024    9:25 AM 08/20/2023   10:37 AM 12/17/2019    9:48 AM  CBC  WBC 4.0 - 10.5 K/uL 16.5  7.2  11.2   Hemoglobin 13.0 - 17.0 g/dL 85.4  83.8  84.6   Hematocrit 39.0 - 52.0 % 41.9  47.5  42.9   Platelets 150 - 400 K/uL 202  187  187       Latest Ref Rng & Units 02/08/2024    9:25 AM 08/20/2023   10:37 AM 12/17/2019    9:48 AM  CMP  Glucose 70 - 99 mg/dL 869  92  96   BUN 6 - 20 mg/dL 24  23  23    Creatinine 0.61 - 1.24 mg/dL 8.45  8.70  8.94   Sodium 135 - 145 mmol/L 137  139  139   Potassium 3.5 - 5.1 mmol/L 4.1  3.9  3.8   Chloride 98 - 111 mmol/L 101  103  106   CO2 22 - 32 mmol/L 26  25  24    Calcium  8.9 - 10.3 mg/dL 9.3  9.6  8.9   Total Protein 6.5 - 8.1 g/dL 6.9     Total Bilirubin 0.0 - 1.2 mg/dL 1.9     Alkaline Phos 38 - 126 U/L 81     AST 15 - 41 U/L 40     ALT 0 - 44 U/L 38       Imaging studies:   CLINICAL DATA:  Acute, non localized abdominal pain.   EXAM: CT ABDOMEN AND PELVIS WITH CONTRAST   TECHNIQUE: Multidetector CT imaging of the abdomen and pelvis was  performed using the standard protocol following bolus administration of intravenous contrast.   RADIATION DOSE REDUCTION: This exam was performed according to the departmental dose-optimization  program which includes automated exposure control, adjustment of the mA and/or kV according to patient size and/or use of iterative reconstruction technique.   CONTRAST:  OMNIPAQUE  IOHEXOL  300 MG/ML  SOLN   COMPARISON:  Chest CTA dated 08/20/2023   FINDINGS: Lower chest: Normal-sized heart. Small amount of linear atelectasis/scarring at both lung bases.   Hepatobiliary: No focal liver abnormality is seen. Status post cholecystectomy. No biliary dilatation.   Pancreas: Unremarkable. No pancreatic ductal dilatation or surrounding inflammatory changes.   Spleen: Normal in size without focal abnormality.   Adrenals/Urinary Tract: Small, simple appearing left renal cysts not needing imaging follow-up. Otherwise, unremarkable adrenal glands, kidneys and ureters. Foley catheter in the urinary bladder with no urine in the bladder.   Stomach/Bowel: Mild sigmoid colon diverticulosis. Mild pericolonic soft tissue stranding in the region of the mid and distal sigmoid colon. There is a small, rim enhancing fluid collection containing a small amount of gas along the inferolateral aspect of the sigmoid colon and contiguous with the sigmoid colon. This measures 1.1 cm in maximum diameter. This is adjacent to a diverticulum containing high density material.   Unremarkable stomach, small bowel and appendix.   Vascular/Lymphatic: Atheromatous arterial calcifications without aneurysm. No enlarged lymph nodes.   Reproductive: Prostate is unremarkable.   Other: No abdominal wall hernia or abnormality. No abdominopelvic ascites.   Musculoskeletal: Mild lumbar and lower thoracic spine degenerative changes.   IMPRESSION: Mild sigmoid colon diverticulitis with a 1.1 cm abscess along  the inferolateral aspect of the distal sigmoid colon.     Electronically Signed   By: Elspeth Bathe M.D.   On: 02/08/2024 12:40  Assessment/Plan:  63 y.o. male with acute complicated diverticulitis with abscess measuring 1.1 cm   - Overall patient is stable. Will manage conservatively for now with IV fluids, IV antibiotics, NPO, and pain management.  Based on clinical presentation, plan to repeat imaging in 48 to 72 hours to reassess treatment plan. If abscess is 5 cm or greater then, plan to consult IR for drain. If patient's symptoms worsen, may repeat imaging earlier and consider surgery. Will also be monitoring creatinine levels  - Continue IV Cipro  and Flagyl    - Continue IV fluids and pain management  - DVT prophylaxis  -- Montrail Mehrer Barrientos PA-C

## 2024-02-08 NOTE — Progress Notes (Signed)
   02/08/24 1509  Vitals  Temp 98.9 F (37.2 C)  Temp Source Oral  BP 100/71  MAP (mmHg) 80  BP Location Left Arm  BP Method Automatic  Pulse Rate 70  Pulse Rate Source Monitor  Resp 18  MEWS COLOR  MEWS Score Color Green  Oxygen Therapy  SpO2 90 %  O2 Device Room Air  MEWS Score  MEWS Temp 0  MEWS Systolic 1  MEWS Pulse 0  MEWS RR 0  MEWS LOC 0  MEWS Score 1   Patient admitted from ED alert, stable vitals, and NPO.  Skin assessment completed with CN Bre, pain managed with PRN Morphine  with all safety precautions in place. Plan of care continues.

## 2024-02-09 LAB — CBC
HCT: 36.6 % — ABNORMAL LOW (ref 39.0–52.0)
Hemoglobin: 12.3 g/dL — ABNORMAL LOW (ref 13.0–17.0)
MCH: 31.1 pg (ref 26.0–34.0)
MCHC: 33.6 g/dL (ref 30.0–36.0)
MCV: 92.4 fL (ref 80.0–100.0)
Platelets: 123 K/uL — ABNORMAL LOW (ref 150–400)
RBC: 3.96 MIL/uL — ABNORMAL LOW (ref 4.22–5.81)
RDW: 13.3 % (ref 11.5–15.5)
WBC: 11.4 K/uL — ABNORMAL HIGH (ref 4.0–10.5)
nRBC: 0 % (ref 0.0–0.2)

## 2024-02-09 LAB — BASIC METABOLIC PANEL WITH GFR
Anion gap: 6 (ref 5–15)
BUN: 24 mg/dL — ABNORMAL HIGH (ref 6–20)
CO2: 26 mmol/L (ref 22–32)
Calcium: 8.3 mg/dL — ABNORMAL LOW (ref 8.9–10.3)
Chloride: 105 mmol/L (ref 98–111)
Creatinine, Ser: 1.34 mg/dL — ABNORMAL HIGH (ref 0.61–1.24)
GFR, Estimated: >60 mL/min (ref >60)
Glucose, Bld: 117 mg/dL — ABNORMAL HIGH (ref 70–99)
Potassium: 3.8 mmol/L (ref 3.5–5.1)
Sodium: 137 mmol/L (ref 135–145)

## 2024-02-09 MED ORDER — HYDROMORPHONE HCL 1 MG/ML IJ SOLN
0.5000 mg | INTRAMUSCULAR | Status: DC | PRN
  Administered 2024-02-09 – 2024-02-14 (×5): 0.5 mg via INTRAVENOUS
  Filled 2024-02-09 (×4): qty 0.5

## 2024-02-09 MED ORDER — CHLORHEXIDINE GLUCONATE CLOTH 2 % EX PADS
6.0000 | MEDICATED_PAD | Freq: Every day | CUTANEOUS | Status: DC
  Administered 2024-02-09 – 2024-02-12 (×5): 6 via TOPICAL

## 2024-02-09 MED ORDER — SODIUM CHLORIDE 0.9 % IV BOLUS
1000.0000 mL | Freq: Once | INTRAVENOUS | Status: AC
  Administered 2024-02-09: 1000 mL via INTRAVENOUS

## 2024-02-09 MED ORDER — SODIUM CHLORIDE 0.9 % IV SOLN: INTRAVENOUS | Status: DC

## 2024-02-09 MED ORDER — SIMETHICONE 80 MG PO CHEW
80.0000 mg | CHEWABLE_TABLET | Freq: Four times a day (QID) | ORAL | Status: DC
  Administered 2024-02-10 – 2024-02-14 (×17): 80 mg via ORAL
  Filled 2024-02-09 (×22): qty 1

## 2024-02-09 NOTE — Plan of Care (Signed)
  Problem: Education: Goal: Knowledge of General Education information will improve Description: Including pain rating scale, medication(s)/side effects and non-pharmacologic comfort measures Outcome: Progressing   Problem: Activity: Goal: Risk for activity intolerance will decrease Outcome: Progressing   Problem: Nutrition: Goal: Adequate nutrition will be maintained Outcome: Progressing   Problem: Elimination: Goal: Will not experience complications related to bowel motility Outcome: Progressing   Problem: Pain Managment: Goal: General experience of comfort will improve and/or be controlled Outcome: Progressing   Problem: Safety: Goal: Ability to remain free from injury will improve Outcome: Progressing

## 2024-02-09 NOTE — Progress Notes (Signed)
 [facility]- General Surgery  SURGICAL PROGRESS NOTE  Hospital Day(s): 1.   Interval History:  Patient's blood pressure has been running low.  He states he usually has a normal blood pressure, systolic BP ranging between 110 and 120.  Opioids have been on hold due to low BP, has just been taking Tylenol  for pain. Increased IV fluids to 125 mL/hr. Since then, last BP was 106/63.  Patient states his pain was well-controlled this morning but is now in pain again.   Had not had a bowel movement or passed gas. WBC has improved. Denies any nausea or vomiting.    Vital signs in last 24 hours: [min-max] current  Temp:  [98.2 F (36.8 C)-99.6 F (37.6 C)] 99.6 F (37.6 C) (08/06 1154) Pulse Rate:  [68-77] 74 (08/06 1154) Resp:  [16-18] 16 (08/06 1154) BP: (81-106)/(51-71) 106/63 (08/06 1154) SpO2:  [90 %-95 %] 93 % (08/06 1154)     Height: 5' 7 (170.2 cm) (Simultaneous filing. User may not have seen previous data.) Weight: 76.2 kg (Simultaneous filing. User may not have seen previous data.) BMI (Calculated): 26.31   Intake/Output last 2 shifts:  08/05 0701 - 08/06 0700 In: 2029.5 [P.O.:220; I.V.:409.5; IV Piggyback:1400] Out: 645 [Urine:645]   Physical Exam:  Constitutional: alert, cooperative and no distress  Respiratory: breathing non-labored at rest  Cardiovascular: regular rate and sinus rhythm  Gastrointestinal: soft, TTP mainly on lower abdomen, and non-distended  Labs:     Latest Ref Rng & Units 02/09/2024    4:31 AM 02/08/2024    9:25 AM 08/20/2023   10:37 AM  CBC  WBC 4.0 - 10.5 K/uL 11.4  16.5  7.2   Hemoglobin 13.0 - 17.0 g/dL 87.6  85.4  83.8   Hematocrit 39.0 - 52.0 % 36.6  41.9  47.5   Platelets 150 - 400 K/uL 123  202  187       Latest Ref Rng & Units 02/09/2024    4:31 AM 02/08/2024    9:25 AM 08/20/2023   10:37 AM  CMP  Glucose 70 - 99 mg/dL 882  869  92   BUN 6 - 20 mg/dL 24  24  23    Creatinine 0.61 - 1.24 mg/dL 8.65  8.45  8.70   Sodium 135 - 145 mmol/L 137   137  139   Potassium 3.5 - 5.1 mmol/L 3.8  4.1  3.9   Chloride 98 - 111 mmol/L 105  101  103   CO2 22 - 32 mmol/L 26  26  25    Calcium  8.9 - 10.3 mg/dL 8.3  9.3  9.6   Total Protein 6.5 - 8.1 g/dL  6.9    Total Bilirubin 0.0 - 1.2 mg/dL  1.9    Alkaline Phos 38 - 126 U/L  81    AST 15 - 41 U/L  40    ALT 0 - 44 U/L  38      Imaging studies: No new pertinent imaging studies   Assessment/Plan:  63 y.o. male with acute complicated diverticulitis with abscess measuring 1.1 cm  Patient reporting increase in pain level likely due to pain medications  weaning off, may require stronger pain med than Tylenol . Can give Dilaudid  0.5 mg every 3 hrs as needed moderate pain. Expecting patient's BP to stabilize increasing IV fluids. Will continue to monitor pain level.  Patient is overall stable. No symptoms of acute abdomen.   - WBC has improved 16.5 >> 11.4   - Continue  IV Cipro /[place]   - Plan to repeat CT of abdomen in 24-48 hrs to reassess treatment plan  - Continue NPO, strictly ice chips only  - Started simethicone    - Continue DVT prophylaxis   -- Janny Crute Barrientos PA-C

## 2024-02-10 LAB — CBC
HCT: 35.8 % — ABNORMAL LOW (ref 39.0–52.0)
Hemoglobin: 12.2 g/dL — ABNORMAL LOW (ref 13.0–17.0)
MCH: 31 pg (ref 26.0–34.0)
MCHC: 34.1 g/dL (ref 30.0–36.0)
MCV: 91.1 fL (ref 80.0–100.0)
Platelets: 119 K/uL — ABNORMAL LOW (ref 150–400)
RBC: 3.93 MIL/uL — ABNORMAL LOW (ref 4.22–5.81)
RDW: 13.2 % (ref 11.5–15.5)
WBC: 9.6 K/uL (ref 4.0–10.5)
nRBC: 0 % (ref 0.0–0.2)

## 2024-02-10 LAB — BASIC METABOLIC PANEL WITH GFR
Anion gap: 7 (ref 5–15)
BUN: 18 mg/dL (ref 6–20)
CO2: 22 mmol/L (ref 22–32)
Calcium: 7.9 mg/dL — ABNORMAL LOW (ref 8.9–10.3)
Chloride: 106 mmol/L (ref 98–111)
Creatinine, Ser: 1.02 mg/dL (ref 0.61–1.24)
GFR, Estimated: >60 mL/min (ref >60)
Glucose, Bld: 94 mg/dL (ref 70–99)
Potassium: 3.7 mmol/L (ref 3.5–5.1)
Sodium: 135 mmol/L (ref 135–145)

## 2024-02-10 MED ORDER — SODIUM CHLORIDE 0.9 % IV SOLN: INTRAVENOUS | Status: AC

## 2024-02-10 NOTE — Plan of Care (Addendum)
 AOx4. Elevated temp 100.64F during the night. Tylenol  1x. Temperature decreased to 98.59F with morning VS. IVF infusing, IV Abx administered during the night. Foley patent- pinkish-amber colored. Patient has remained NPO except ice chips and sips of water with med administration. SCDs in place. Patient was able to sleep for a few hours during the night. He did not sleep the previous night. Patient reports pain to lower abdomen that increases with movement but he declined pain medication throughout the night. Patient passed gas and had small smear of BM during the night. CHG administered as well as Foleycare and pericare.  Family at bedside- supportive. Safety measures in place throughout the night.  Problem: Education: Goal: Knowledge of General Education information will improve Description: Including pain rating scale, medication(s)/side effects and non-pharmacologic comfort measures Outcome: Progressing   Problem: Coping: Goal: Level of anxiety will decrease Outcome: Progressing   Problem: Pain Managment: Goal: General experience of comfort will improve and/or be controlled Outcome: Progressing

## 2024-02-10 NOTE — Progress Notes (Signed)
 [facility] General Surgery  SURGICAL PROGRESS NOTE  Hospital Day(s): 2.   Interval History:  Patient seen and examined. No acute events or new complaints overnight.  Patient reports feeling about the same. Pain is still present but overall controlled.  Denies any nausea/vomiting.  Had a bowel movement earlier this morning.   Vital signs in last 24 hours: [min-max] current  Temp:  [98.6 F (37 C)-100.9 F (38.3 C)] 98.7 F (37.1 C) (08/07 0800) Pulse Rate:  [65-84] 76 (08/07 0800) Resp:  [16-19] 16 (08/07 0800) BP: (100-122)/(64-72) 122/72 (08/07 0800) SpO2:  [93 %-97 %] 93 % (08/07 0800)     Height: 5' 7 (170.2 cm) (Simultaneous filing. User may not have seen previous data.) Weight: 76.2 kg (Simultaneous filing. User may not have seen previous data.) BMI (Calculated): 26.31   Intake/Output last 2 shifts:  08/06 0701 - 08/07 0700 In: 3756.2 [P.O.:360; I.V.:2796.2; IV Piggyback:600] Out: 1400 [Urine:1400]   Physical Exam:  Constitutional: alert, cooperative and no distress  Respiratory: breathing non-labored at rest  Cardiovascular: regular rate and sinus rhythm  Gastrointestinal: soft,tender, and non-distended  Labs:     Latest Ref Rng & Units 02/10/2024    5:18 AM 02/09/2024    4:31 AM 02/08/2024    9:25 AM  CBC  WBC 4.0 - 10.5 K/uL 9.6  11.4  16.5   Hemoglobin 13.0 - 17.0 g/dL 87.7  87.6  85.4   Hematocrit 39.0 - 52.0 % 35.8  36.6  41.9   Platelets 150 - 400 K/uL 119  123  202       Latest Ref Rng & Units 02/10/2024    5:18 AM 02/09/2024    4:31 AM 02/08/2024    9:25 AM  CMP  Glucose 70 - 99 mg/dL 94  882  869   BUN 6 - 20 mg/dL 18  24  24    Creatinine 0.61 - 1.24 mg/dL 8.97  8.65  8.45   Sodium 135 - 145 mmol/L 135  137  137   Potassium 3.5 - 5.1 mmol/L 3.7  3.8  4.1   Chloride 98 - 111 mmol/L 106  105  101   CO2 22 - 32 mmol/L 22  26  26    Calcium  8.9 - 10.3 mg/dL 7.9  8.3  9.3   Total Protein 6.5 - 8.1 g/dL   6.9   Total Bilirubin 0.0 - 1.2 mg/dL   1.9    Alkaline Phos 38 - 126 U/L   81   AST 15 - 41 U/L   40   ALT 0 - 44 U/L   38     Imaging studies: No new pertinent imaging studies   Assessment/Plan:  63 y.o. male with acute complicated diverticulitis with abscess measuring 1.1 cm.  Patient is overall doing well.  Still having persistent abdominal pain, not improving.  No signs of acute abdomen.  Leukocytosis has resolved. Planning to repeat CT of abdomen tomorrow.  Due to clinical presentation, it is unlikely abscess is resolving. May consult IR tomorrow if CT of abdomen still shows abscess.  Since labs are improving and patient had return of GI function, will start clear liquid diet. Plan to continue Foley cath until pain improves.   - Continue pain management  - Continue DVT prophylaxis with Lovenox   -- Caris Cerveny Barrientos PA-C

## 2024-02-11 ENCOUNTER — Inpatient Hospital Stay

## 2024-02-11 LAB — CBC
HCT: 35.9 % — ABNORMAL LOW (ref 39.0–52.0)
Hemoglobin: 12.2 g/dL — ABNORMAL LOW (ref 13.0–17.0)
MCH: 31 pg (ref 26.0–34.0)
MCHC: 34 g/dL (ref 30.0–36.0)
MCV: 91.1 fL (ref 80.0–100.0)
Platelets: 148 K/uL — ABNORMAL LOW (ref 150–400)
RBC: 3.94 MIL/uL — ABNORMAL LOW (ref 4.22–5.81)
RDW: 12.9 % (ref 11.5–15.5)
WBC: 8.8 K/uL (ref 4.0–10.5)
nRBC: 0 % (ref 0.0–0.2)

## 2024-02-11 LAB — BASIC METABOLIC PANEL WITH GFR
Anion gap: 8 (ref 5–15)
BUN: 12 mg/dL (ref 6–20)
CO2: 26 mmol/L (ref 22–32)
Calcium: 8.3 mg/dL — ABNORMAL LOW (ref 8.9–10.3)
Chloride: 102 mmol/L (ref 98–111)
Creatinine, Ser: 1.02 mg/dL (ref 0.61–1.24)
GFR, Estimated: >60 mL/min (ref >60)
Glucose, Bld: 114 mg/dL — ABNORMAL HIGH (ref 70–99)
Potassium: 3.9 mmol/L (ref 3.5–5.1)
Sodium: 136 mmol/L (ref 135–145)

## 2024-02-11 MED ORDER — IOHEXOL 300 MG/ML  SOLN
100.0000 mL | Freq: Once | INTRAMUSCULAR | Status: AC | PRN
  Administered 2024-02-11: 100 mL via INTRAVENOUS

## 2024-02-11 MED ORDER — GABAPENTIN 100 MG PO CAPS
100.0000 mg | ORAL_CAPSULE | Freq: Two times a day (BID) | ORAL | Status: DC
  Administered 2024-02-11 – 2024-02-14 (×8): 100 mg via ORAL
  Filled 2024-02-11 (×7): qty 1

## 2024-02-11 MED ORDER — CELECOXIB 100 MG PO CAPS
100.0000 mg | ORAL_CAPSULE | Freq: Every day | ORAL | Status: DC
  Administered 2024-02-11 – 2024-02-14 (×5): 100 mg via ORAL
  Filled 2024-02-11 (×4): qty 1

## 2024-02-11 NOTE — Plan of Care (Signed)
  Problem: Education: Goal: Knowledge of General Education information will improve Description: Including pain rating scale, medication(s)/side effects and non-pharmacologic comfort measures Outcome: Progressing   Problem: Coping: Goal: Level of anxiety will decrease Outcome: Progressing   Problem: Pain Managment: Goal: General experience of comfort will improve and/or be controlled Outcome: Progressing

## 2024-02-11 NOTE — Progress Notes (Addendum)
 [facility]- General Surgery  SURGICAL PROGRESS NOTE  Hospital Day(s): 3.   Interval History:  Patient seen and examined. States he is doing well, still no change in clinical presentation.  Continues to have abdominal pain, mainly on the right lower quadrant radiating to suprapubic area. WBC still within normal limits. States he tolerated clear liquids. Denies nausea or vomiting. Still passing gas and reports having a small bowel movement the night before yesterday. Planning fo for repeat CT imaging this morning. Placed NPO incase we need to consult IR.    Vital signs in last 24 hours: [min-max] current  Temp:  [97.9 F (36.6 C)-100.1 F (37.8 C)] 99.3 F (37.4 C) (08/08 0826) Pulse Rate:  [57-75] 57 (08/08 0826) Resp:  [16-18] 17 (08/08 0826) BP: (108-122)/(71-79) 120/79 (08/08 0826) SpO2:  [92 %-94 %] 94 % (08/08 0826)     Height: 5' 7 (170.2 cm) (Simultaneous filing. User may not have seen previous data.) Weight: 76.2 kg (Simultaneous filing. User may not have seen previous data.) BMI (Calculated): 26.31   Intake/Output last 2 shifts:  08/07 0701 - 08/08 0700 In: 1348.6 [I.V.:1048.6; IV Piggyback:300] Out: 3400 [Urine:3400]   Physical Exam:  Constitutional: alert, cooperative and no distress  Respiratory: breathing non-labored at rest  Cardiovascular: regular rate and sinus rhythm  Gastrointestinal: soft, tender on right lower quadrant and suprapubic, and mildly distended distended  Labs:     Latest Ref Rng & Units 02/11/2024    2:43 AM 02/10/2024    5:18 AM 02/09/2024    4:31 AM  CBC  WBC 4.0 - 10.5 K/uL 8.8  9.6  11.4   Hemoglobin 13.0 - 17.0 g/dL 87.7  87.7  87.6   Hematocrit 39.0 - 52.0 % 35.9  35.8  36.6   Platelets 150 - 400 K/uL 148  119  123       Latest Ref Rng & Units 02/11/2024    2:43 AM 02/10/2024    5:18 AM 02/09/2024    4:31 AM  CMP  Glucose 70 - 99 mg/dL 885  94  882   BUN 6 - 20 mg/dL 12  18  24    Creatinine 0.61 - 1.24 mg/dL 8.97  8.97  8.65   Sodium  135 - 145 mmol/L 136  135  137   Potassium 3.5 - 5.1 mmol/L 3.9  3.7  3.8   Chloride 98 - 111 mmol/L 102  106  105   CO2 22 - 32 mmol/L 26  22  26    Calcium  8.9 - 10.3 mg/dL 8.3  7.9  8.3     Imaging studies:  EXAM: CT ABDOMEN AND PELVIS WITH CONTRAST 02/11/2024 09:14:07 AM   TECHNIQUE: CT of the abdomen and pelvis was performed with the administration of intravenous contrast. Multiplanar reformatted images are provided for review. Automated exposure control, iterative reconstruction, and/or weight based adjustment of the mA/kV was utilized to reduce the radiation dose to as low as reasonably achievable.   COMPARISON: CT of the abdomen and pelvis dated 02/08/2024.   CLINICAL HISTORY: Diverticulitis, complication suspected. Re-eval known divert.   FINDINGS:   LOWER CHEST: There has been interval worsening of streaky vascular opacification/consolidation within the lower lobes bilaterally and there is worsening bilateral dependent atelectasis. There are new small bilateral pleural effusions, slightly larger on the right.   LIVER: The patient is status post cholecystectomy.   GALLBLADDER AND BILE DUCTS: Gallbladder is surgically absent. No biliary ductal dilatation.   SPLEEN: No acute abnormality.  PANCREAS: No acute abnormality.   ADRENAL GLANDS: No acute abnormality.   KIDNEYS, URETERS AND BLADDER: There is a small simple cyst within the lower pole of the left kidney, which does not require follow up. The kidneys are otherwise normal. No stones in the kidneys or ureters. No hydronephrosis. No perinephric or periureteral stranding. A Foley catheter is present.   GI AND BOWEL: There is diffuse abnormal thickening of the wall of the sigmoid colon, which is contracted. There is adjacent soft tissue stranding. There is an inflamed diverticulum with a small diverticular abscess, measuring approximately 2 x 2 x 1 cm.   PERITONEUM AND RETROPERITONEUM: No ascites.  No free air.   VASCULATURE: Aorta is normal in caliber.   LYMPH NODES: No lymphadenopathy.   REPRODUCTIVE ORGANS: No acute abnormality.   BONES AND SOFT TISSUES: No acute osseous abnormality. No focal soft tissue abnormality.   IMPRESSION: 1. Worsening bilateral lower lobe consolidation and dependent atelectasis compared to prior study. New small bilateral pleural effusions, slightly larger on the right. 2. Status post cholecystectomy. 3. Diverticulitis in the sigmoid colon with a small diverticular abscess measuring approximately 2 x 2 x 1 cm.   Electronically signed by: evalene coho 02/11/2024 09:55 AM EDT RP Workstation: [id number]   Assessment/Plan:  63 y.o. male with acute complicated diverticulitis, Day 3.   - Stable vital signs, no fever and not tachycardic  - WBC still within normal limit, but no improvement in abdominal discomfort   - Repeat CT of abdomen shows diverticular abscess measuring about 2 x 2 x 1 cm. Consulted IR - not amenable for drainage. Will add Celebrex  and Gabapentin  for better pain control.    - Continue IV cipro arlette  - Will start clear liquid diet    - Discontinue foley catheter  - Continue pain management and DVT prophylaxis    -- Sukhmani Fetherolf Barrientos PA-C

## 2024-02-11 NOTE — Plan of Care (Signed)
 Brief Interventional Radiology Note:  63 year old male admitted for acute complicated diverticulitis. CT A/P on 8/8 notable for small diverticular abscess measuring approximately 2 x 2 x 1cm. Per chart review patient continues to complain of RLQ abdominal pain radiating to his suprapubic area. WBC 8.8 today down from 16.4 on 8/5. Case and imaging reviewed by Dr. DELENA Balder who does not feel the area is amenable to draining at this time. IR recommending continued antibiotics and pain control. Continue to monitor and consider re-imaging in a few days if pain persists or worsens.  IR will delete consult request.   Please reach out or place new consult if patient status changes and IR can be of any assistance.   Electronically Signed: Delsa Walder M Lacreasha Hinds, PA-C 02/11/2024, 12:08 PM

## 2024-02-12 DIAGNOSIS — K5792 Diverticulitis of intestine, part unspecified, without perforation or abscess without bleeding: Secondary | ICD-10-CM | POA: Diagnosis not present

## 2024-02-12 DIAGNOSIS — M25572 Pain in left ankle and joints of left foot: Secondary | ICD-10-CM

## 2024-02-12 LAB — BASIC METABOLIC PANEL WITH GFR
Anion gap: 10 (ref 5–15)
BUN: 10 mg/dL (ref 6–20)
CO2: 24 mmol/L (ref 22–32)
Calcium: 8.2 mg/dL — ABNORMAL LOW (ref 8.9–10.3)
Chloride: 103 mmol/L (ref 98–111)
Creatinine, Ser: 1.01 mg/dL (ref 0.61–1.24)
GFR, Estimated: >60 mL/min (ref >60)
Glucose, Bld: 109 mg/dL — ABNORMAL HIGH (ref 70–99)
Potassium: 3.5 mmol/L (ref 3.5–5.1)
Sodium: 137 mmol/L (ref 135–145)

## 2024-02-12 LAB — CBC
HCT: 37.1 % — ABNORMAL LOW (ref 39.0–52.0)
Hemoglobin: 12.9 g/dL — ABNORMAL LOW (ref 13.0–17.0)
MCH: 31 pg (ref 26.0–34.0)
MCHC: 34.8 g/dL (ref 30.0–36.0)
MCV: 89.2 fL (ref 80.0–100.0)
Platelets: 162 K/uL (ref 150–400)
RBC: 4.16 MIL/uL — ABNORMAL LOW (ref 4.22–5.81)
RDW: 12.8 % (ref 11.5–15.5)
WBC: 6.4 K/uL (ref 4.0–10.5)
nRBC: 0 % (ref 0.0–0.2)

## 2024-02-12 MED ORDER — KETOROLAC TROMETHAMINE 30 MG/ML IJ SOLN
30.0000 mg | Freq: Four times a day (QID) | INTRAMUSCULAR | Status: DC | PRN
  Administered 2024-02-13 – 2024-02-14 (×3): 30 mg via INTRAVENOUS
  Filled 2024-02-12 (×2): qty 1

## 2024-02-12 NOTE — Progress Notes (Signed)
 General Surgery  SURGICAL PROGRESS NOTE  Hospital Day(s): 4.   Interval History:  Patient seen and examined. States he is doing well, however much more concerned today regarding persistence of his pain that came after voiding and bowel activity.  His pain was suprapubic in location, and lasted for about an hour after his bathroom visit and was quite concerning.  Denies any blood per rectum from rectum, denies any change in urinary habits, no pneumaturia or intermittent stream.  Seems to tolerate clear liquids fine. WBC still within normal limits. Denies nausea or vomiting.  Small diverticular abscess noted on yesterday's CT scan.  Vital signs in last 24 hours: [min-max] current  Temp:  [98.3 F (36.8 C)-98.9 F (37.2 C)] 98.3 F (36.8 C) (08/09 0737) Pulse Rate:  [61-66] 61 (08/09 0737) Resp:  [17-20] 17 (08/09 0737) BP: (116-131)/(72-80) 121/77 (08/09 0737) SpO2:  [93 %-96 %] 95 % (08/09 0737)     Height: 5' 7 (170.2 cm) (Simultaneous filing. User may not have seen previous data.) Weight: 76.2 kg (Simultaneous filing. User may not have seen previous data.) BMI (Calculated): 26.31   Intake/Output last 2 shifts:  08/08 0701 - 08/09 0700 In: 240 [P.O.:240] Out: 2700 [Urine:2700]   Physical Exam:  Constitutional: alert, cooperative and no distress  Respiratory: breathing non-labored at rest  Cardiovascular: regular rate and sinus rhythm  Gastrointestinal: soft, tender on right lower quadrant and suprapubic, and mildly distended distended  Labs:     Latest Ref Rng & Units 02/12/2024    4:05 AM 02/11/2024    2:43 AM 02/10/2024    5:18 AM  CBC  WBC 4.0 - 10.5 K/uL 6.4  8.8  9.6   Hemoglobin 13.0 - 17.0 g/dL 87.0  87.7  87.7   Hematocrit 39.0 - 52.0 % 37.1  35.9  35.8   Platelets 150 - 400 K/uL 162  148  119       Latest Ref Rng & Units 02/12/2024    4:05 AM 02/11/2024    2:43 AM 02/10/2024    5:18 AM  CMP  Glucose 70 - 99 mg/dL 890  885  94   BUN 6 - 20 mg/dL 10  12  18    Creatinine  0.61 - 1.24 mg/dL 8.98  8.97  8.97   Sodium 135 - 145 mmol/L 137  136  135   Potassium 3.5 - 5.1 mmol/L 3.5  3.9  3.7   Chloride 98 - 111 mmol/L 103  102  106   CO2 22 - 32 mmol/L 24  26  22    Calcium  8.9 - 10.3 mg/dL 8.2  8.3  7.9     Imaging studies:  EXAM: CT ABDOMEN AND PELVIS WITH CONTRAST 02/11/2024 09:14:07 AM   TECHNIQUE: CT of the abdomen and pelvis was performed with the administration of intravenous contrast. Multiplanar reformatted images are provided for review. Automated exposure control, iterative reconstruction, and/or weight based adjustment of the mA/kV was utilized to reduce the radiation dose to as low as reasonably achievable.   COMPARISON: CT of the abdomen and pelvis dated 02/08/2024.   CLINICAL HISTORY: Diverticulitis, complication suspected. Re-eval known divert.   FINDINGS:   LOWER CHEST: There has been interval worsening of streaky vascular opacification/consolidation within the lower lobes bilaterally and there is worsening bilateral dependent atelectasis. There are new small bilateral pleural effusions, slightly larger on the right.   LIVER: The patient is status post cholecystectomy.   GALLBLADDER AND BILE DUCTS: Gallbladder is surgically absent. No biliary  ductal dilatation.   SPLEEN: No acute abnormality.   PANCREAS: No acute abnormality.   ADRENAL GLANDS: No acute abnormality.   KIDNEYS, URETERS AND BLADDER: There is a small simple cyst within the lower pole of the left kidney, which does not require follow up. The kidneys are otherwise normal. No stones in the kidneys or ureters. No hydronephrosis. No perinephric or periureteral stranding. A Foley catheter is present.   GI AND BOWEL: There is diffuse abnormal thickening of the wall of the sigmoid colon, which is contracted. There is adjacent soft tissue stranding. There is an inflamed diverticulum with a small diverticular abscess, measuring approximately 2 x 2 x 1 cm.    PERITONEUM AND RETROPERITONEUM: No ascites. No free air.   VASCULATURE: Aorta is normal in caliber.   LYMPH NODES: No lymphadenopathy.   REPRODUCTIVE ORGANS: No acute abnormality.   BONES AND SOFT TISSUES: No acute osseous abnormality. No focal soft tissue abnormality.   IMPRESSION: 1. Worsening bilateral lower lobe consolidation and dependent atelectasis compared to prior study. New small bilateral pleural effusions, slightly larger on the right. 2. Status post cholecystectomy. 3. Diverticulitis in the sigmoid colon with a small diverticular abscess measuring approximately 2 x 2 x 1 cm.   Electronically signed by: evalene coho 02/11/2024 09:55 AM EDT RP Workstation: [id number]   Assessment/Plan:  63 y.o. male with acute complicated diverticulitis, Day 3.   - Stable vital signs, no fever and not tachycardic  - WBC still within normal limit, but increased duration in abdominal discomfort, after voiding and defecating/loose stool.  - Repeat CT of abdomen shows diverticular abscess measuring about 2 x 2 x 1 cm. Consulted IR - not amenable for drainage. Will add Celebrex  and Gabapentin  for better pain control.    - Continue IV cipro /Flagyl .  - Will stay on clear liquid diet for now.  - Continue pain management and DVT prophylaxis   - Complaint of gout-like left ankle pain.  Offered Toradol  for severe pain.    Honor Leghorn, M.D., [facility]  02/12/2024 ; 9:48 AM

## 2024-02-12 NOTE — Plan of Care (Signed)

## 2024-02-12 NOTE — Plan of Care (Signed)
   Problem: Education: Goal: Knowledge of General Education information will improve Description: Including pain rating scale, medication(s)/side effects and non-pharmacologic comfort measures Outcome: Progressing   Problem: Health Behavior/Discharge Planning: Goal: Ability to manage health-related needs will improve Outcome: Progressing   Problem: Clinical Measurements: Goal: Diagnostic test results will improve Outcome: Progressing

## 2024-02-13 DIAGNOSIS — K5792 Diverticulitis of intestine, part unspecified, without perforation or abscess without bleeding: Secondary | ICD-10-CM | POA: Diagnosis not present

## 2024-02-13 LAB — BASIC METABOLIC PANEL WITH GFR
Anion gap: 11 (ref 5–15)
BUN: 10 mg/dL (ref 6–20)
CO2: 28 mmol/L (ref 22–32)
Calcium: 8.6 mg/dL — ABNORMAL LOW (ref 8.9–10.3)
Chloride: 99 mmol/L (ref 98–111)
Creatinine, Ser: 0.99 mg/dL (ref 0.61–1.24)
GFR, Estimated: >60 mL/min (ref >60)
Glucose, Bld: 111 mg/dL — ABNORMAL HIGH (ref 70–99)
Potassium: 3.7 mmol/L (ref 3.5–5.1)
Sodium: 138 mmol/L (ref 135–145)

## 2024-02-13 LAB — CULTURE, BLOOD (ROUTINE X 2)
Culture: NO GROWTH
Culture: NO GROWTH

## 2024-02-13 LAB — CBC
HCT: 37.1 % — ABNORMAL LOW (ref 39.0–52.0)
Hemoglobin: 12.7 g/dL — ABNORMAL LOW (ref 13.0–17.0)
MCH: 31.2 pg (ref 26.0–34.0)
MCHC: 34.2 g/dL (ref 30.0–36.0)
MCV: 91.2 fL (ref 80.0–100.0)
Platelets: 185 K/uL (ref 150–400)
RBC: 4.07 MIL/uL — ABNORMAL LOW (ref 4.22–5.81)
RDW: 12.9 % (ref 11.5–15.5)
WBC: 7.3 K/uL (ref 4.0–10.5)
nRBC: 0 % (ref 0.0–0.2)

## 2024-02-13 MED ORDER — DIPHENHYDRAMINE HCL 25 MG PO CAPS
50.0000 mg | ORAL_CAPSULE | Freq: Four times a day (QID) | ORAL | Status: DC | PRN
  Administered 2024-02-13: 50 mg via ORAL
  Filled 2024-02-13: qty 2

## 2024-02-13 MED ORDER — HYDROCORTISONE 0.5 % EX CREA
TOPICAL_CREAM | Freq: Two times a day (BID) | CUTANEOUS | Status: DC
  Filled 2024-02-13: qty 28.35

## 2024-02-13 NOTE — Progress Notes (Addendum)
 General Surgery  SURGICAL PROGRESS NOTE  Hospital Day(s): 5.   Interval History:  Patient seen and examined. States he is doing well, however much improved today with diminished  pain that comes with voiding and bowel activity.  Denies any blood per rectum, denies any change in urinary habits, no pneumaturia or intermittent stream.  Seems to tolerate clear liquids fine. WBC still within normal limits. Denies nausea or vomiting.  Small diverticular abscess noted on most recent CT scan.  Vital signs in last 24 hours: [min-max] current  Temp:  [98.4 F (36.9 C)-98.8 F (37.1 C)] 98.6 F (37 C) (08/10 0743) Pulse Rate:  [58-66] 58 (08/10 0743) Resp:  [17-18] 17 (08/10 0743) BP: (100-117)/(64-76) 110/74 (08/10 0743) SpO2:  [94 %-98 %] 98 % (08/10 0743)     Height: 5' 7 (170.2 cm) (Simultaneous filing. User may not have seen previous data.) Weight: 76.2 kg (Simultaneous filing. User may not have seen previous data.) BMI (Calculated): 26.31   Intake/Output last 2 shifts:  08/09 0701 - 08/10 0700 In: 530 [P.O.:530] Out: -    Physical Exam:  Constitutional: alert, cooperative and no distress  Respiratory: breathing non-labored at rest  Cardiovascular: regular rate and sinus rhythm  Gastrointestinal: soft, tender on right lower quadrant and suprapubic, and not significantly distended   Labs:     Latest Ref Rng & Units 02/13/2024    5:02 AM 02/12/2024    4:05 AM 02/11/2024    2:43 AM  CBC  WBC 4.0 - 10.5 K/uL 7.3  6.4  8.8   Hemoglobin 13.0 - 17.0 g/dL 87.2  87.0  87.7   Hematocrit 39.0 - 52.0 % 37.1  37.1  35.9   Platelets 150 - 400 K/uL 185  162  148       Latest Ref Rng & Units 02/13/2024    5:02 AM 02/12/2024    4:05 AM 02/11/2024    2:43 AM  CMP  Glucose 70 - 99 mg/dL 888  890  885   BUN 6 - 20 mg/dL 10  10  12    Creatinine 0.61 - 1.24 mg/dL 9.00  8.98  8.97   Sodium 135 - 145 mmol/L 138  137  136   Potassium 3.5 - 5.1 mmol/L 3.7  3.5  3.9   Chloride 98 - 111 mmol/L 99  103  102    CO2 22 - 32 mmol/L 28  24  26    Calcium  8.9 - 10.3 mg/dL 8.6  8.2  8.3     Imaging studies:  EXAM: CT ABDOMEN AND PELVIS WITH CONTRAST 02/11/2024 09:14:07 AM   TECHNIQUE: CT of the abdomen and pelvis was performed with the administration of intravenous contrast. Multiplanar reformatted images are provided for review. Automated exposure control, iterative reconstruction, and/or weight based adjustment of the mA/kV was utilized to reduce the radiation dose to as low as reasonably achievable.   COMPARISON: CT of the abdomen and pelvis dated 02/08/2024.   CLINICAL HISTORY: Diverticulitis, complication suspected. Re-eval known divert.   FINDINGS:   LOWER CHEST: There has been interval worsening of streaky vascular opacification/consolidation within the lower lobes bilaterally and there is worsening bilateral dependent atelectasis. There are new small bilateral pleural effusions, slightly larger on the right.   LIVER: The patient is status post cholecystectomy.   GALLBLADDER AND BILE DUCTS: Gallbladder is surgically absent. No biliary ductal dilatation.   SPLEEN: No acute abnormality.   PANCREAS: No acute abnormality.   ADRENAL GLANDS: No acute abnormality.  KIDNEYS, URETERS AND BLADDER: There is a small simple cyst within the lower pole of the left kidney, which does not require follow up. The kidneys are otherwise normal. No stones in the kidneys or ureters. No hydronephrosis. No perinephric or periureteral stranding. A Foley catheter is present.   GI AND BOWEL: There is diffuse abnormal thickening of the wall of the sigmoid colon, which is contracted. There is adjacent soft tissue stranding. There is an inflamed diverticulum with a small diverticular abscess, measuring approximately 2 x 2 x 1 cm.   PERITONEUM AND RETROPERITONEUM: No ascites. No free air.   VASCULATURE: Aorta is normal in caliber.   LYMPH NODES: No lymphadenopathy.   REPRODUCTIVE  ORGANS: No acute abnormality.   BONES AND SOFT TISSUES: No acute osseous abnormality. No focal soft tissue abnormality.   IMPRESSION: 1. Worsening bilateral lower lobe consolidation and dependent atelectasis compared to prior study. New small bilateral pleural effusions, slightly larger on the right. 2. Status post cholecystectomy. 3. Diverticulitis in the sigmoid colon with a small diverticular abscess measuring approximately 2 x 2 x 1 cm.   Electronically signed by: evalene coho 02/11/2024 09:55 AM EDT RP Workstation: [id number]   Assessment/Plan:  63 y.o. male with acute complicated diverticulitis, Day 3.   - Stable vital signs, no fever and not tachycardic  - WBC still within normal limits  - Last CT of abdomen shows diverticular abscess measuring about 2 x 2 x 1 cm.   - Continue IV cipro /Flagyl .  - Will advance to full liquid diet for now, and observe for improvement in abdominal discomfort  - Continue pain management and DVT prophylaxis   - No mention today of left ankle pain.  I personally spent a total of 25 minutes in the care of the patient today including preparing to see the patient, getting/reviewing separately obtained history, performing a medically appropriate exam/evaluation, placing orders, and documenting clinical information in the EHR.   Honor Leghorn, M.D., [facility]  02/13/2024 ; 10:53 AM

## 2024-02-13 NOTE — Plan of Care (Signed)

## 2024-02-13 NOTE — Plan of Care (Signed)

## 2024-02-14 MED ORDER — METRONIDAZOLE 500 MG PO TABS: 500.0000 mg | ORAL_TABLET | Freq: Three times a day (TID) | ORAL | 0 refills | Status: AC

## 2024-02-14 MED ORDER — CIPROFLOXACIN HCL 500 MG PO TABS: 500.0000 mg | ORAL_TABLET | Freq: Two times a day (BID) | ORAL | 0 refills | Status: AC

## 2024-02-14 NOTE — Discharge Instructions (Signed)
  Diet: Resume home heart healthy regular diet.   Medications: Resume all home medications. For mild to moderate pain: acetaminophen  (Tylenol ) or ibuprofen (if no kidney disease). Combining Tylenol  with alcohol can substantially increase your risk of causing liver disease. Narcotic pain medications, if prescribed, can be used for severe pain, though may cause nausea, constipation, and drowsiness. Do not combine Tylenol  and Norco within a 6 hour period as Norco contains Tylenol . If you do not need the narcotic pain medication, you do not need to fill the prescription. Take antibiotics as instructed.   Call office [phone]) at any time if any questions, worsening pain, fevers/chills, bleeding, drainage from incision site, or other concerns.

## 2024-02-14 NOTE — Plan of Care (Signed)

## 2024-02-14 NOTE — Plan of Care (Signed)

## 2024-02-14 NOTE — Plan of Care (Signed)
  Problem: Education: Goal: Knowledge of General Education information will improve Description: Including pain rating scale, medication(s)/side effects and non-pharmacologic comfort measures 02/14/2024 1329 by Joshua Andrez PARAS, LPN Outcome: Adequate for Discharge 02/14/2024 1115 by Joshua Andrez PARAS, LPN Outcome: Progressing   Problem: Health Behavior/Discharge Planning: Goal: Ability to manage health-related needs will improve 02/14/2024 1329 by Joshua Andrez PARAS, LPN Outcome: Adequate for Discharge 02/14/2024 1115 by Joshua Andrez PARAS, LPN Outcome: Progressing   Problem: Clinical Measurements: Goal: Ability to maintain clinical measurements within normal limits will improve 02/14/2024 1329 by Joshua Andrez PARAS, LPN Outcome: Adequate for Discharge 02/14/2024 1115 by Joshua Andrez PARAS, LPN Outcome: Progressing Goal: Will remain free from infection Outcome: Adequate for Discharge Goal: Diagnostic test results will improve 02/14/2024 1329 by Joshua Andrez PARAS, LPN Outcome: Adequate for Discharge 02/14/2024 1115 by Joshua Andrez PARAS, LPN Outcome: Progressing Goal: Respiratory complications will improve Outcome: Adequate for Discharge Goal: Cardiovascular complication will be avoided Outcome: Adequate for Discharge   Problem: Activity: Goal: Risk for activity intolerance will decrease Outcome: Adequate for Discharge   Problem: Nutrition: Goal: Adequate nutrition will be maintained Outcome: Adequate for Discharge   Problem: Coping: Goal: Level of anxiety will decrease Outcome: Adequate for Discharge   Problem: Elimination: Goal: Will not experience complications related to bowel motility Outcome: Adequate for Discharge Goal: Will not experience complications related to urinary retention Outcome: Adequate for Discharge   Problem: Pain Managment: Goal: General experience of comfort will improve and/or be controlled Outcome: Adequate for Discharge   Problem: Safety: Goal: Ability to remain free  from injury will improve Outcome: Adequate for Discharge   Problem: Skin Integrity: Goal: Risk for impaired skin integrity will decrease Outcome: Adequate for Discharge

## 2024-02-14 NOTE — Discharge Summary (Signed)
 [facility]-General Surgery  SURGICAL DISCHARGE SUMMARY  Patient ID: Erik Carpenter MRN: [id number] DOB/AGE: Aug 22, 1960 63 y.o.  Admit date: 02/08/2024 Discharge date: 02/14/2024  Discharge Diagnoses Patient Active Problem List   Diagnosis Date Noted   Pain of joint of left ankle and foot 02/12/2024   Acute diverticulitis 02/08/2024    Consultants Interventional Radiologist   Procedures None    Hospital Course:  Patient presented to the [facility] ED on 02/08/2024 with lower abdominal pain.  In the ED, labs and imaging were ordered. Labs showed  leukocytosis with a WBC of 16.5.  Elevated creatinine of 1.54 and elevated total bilirubin of 1.9.  LFTs and alkaline phos were within normal limits.  No electrolyte disturbances noted. Lipase level 37.  CT of abdomen showed mild sigmoid colon diverticulosis with fluid collection measuring 1.1 cm. Findings consistent with acute complicated diverticulitis.  Managed conservatively with IV ciprofloxacin /metronidazole , IV fluids and pain management. Patient continued to experience persistent abdominal discomfort. Repeated CT of abdomen/pelvis on 02/11/2024 which showed small diverticular abscess, measuring about  2 x 2 x 1 cm. Consulted IR - abscess not amenable for drainage.  For the last few days, patient's pain has been better controlled. Reports experiencing intermittent suprapubic pain when straining to urinate.  States he did not have this pain prior to this episode of diverticulitis, likely related to inflammation. Patient is now tolerating regular diet post-op and is able to ambulate. Pain is well-controlled.  At this point, patient is cleared from surgical standpoint.  No surgical intervention needed. Will complete the 14-day course of antibiotic outpatient.  Patient will follow-up outpatient with Dr. Tye in one week.     Physical Examination:  Constitutional: alert, in no acute distress Pulmonary: CTA bilaterally, normal breath  sounds Cardiac: regular rate and rhythm Gastrointestinal: soft, non-tender, and non-distended   Allergies as of 02/14/2024       Reactions   Oxycodone Itching        Medication List     TAKE these medications    allopurinol  100 MG tablet Commonly known as: ZYLOPRIM  Take 200 mg by mouth daily.   aspirin  EC 81 MG tablet Take 81 mg by mouth once.   atorvastatin  20 MG tablet Commonly known as: LIPITOR Take 20 mg by mouth.  TAKE ONE TABLET BY MOUTH AT BEDTIME FOR CHOLESTEROL   ciprofloxacin  500 MG tablet Commonly known as: Cipro  Take 1 tablet (500 mg total) by mouth 2 (two) times daily for 8 days.   diltiazem  120 MG 24 hr capsule Commonly known as: DILACOR XR  Take 120 mg by mouth.  TAKE ONE CAPSULE BY MOUTH EVERY DAY THIS REPLACES METOPROLOL   metroNIDAZOLE  500 MG tablet Commonly known as: FLAGYL  Take 1 tablet (500 mg total) by mouth 3 (three) times daily for 8 days.   terbinafine  250 MG tablet Commonly known as: LAMISIL  Take 250 mg by mouth.  TAKE ONE TABLET BY MOUTH EVERY DAY   traZODone  100 MG tablet Commonly known as: DESYREL  Take 100 mg by mouth.  TAKE ONE TABLET BY MOUTH AT BEDTIME FOR SLEEP          Follow-up Information     Tye, Isami, DO Follow up in 1 week(s).   Specialties: General Surgery, Surgery Why: f/u acute complicated diverticulitis (taking abx at home) Contact information: [address] [place] [address] [phone]                  Time spent on discharge management including discussion of hospital  course, clinical condition, outpatient instructions, prescriptions, and follow up with the patient and members of the medical team: >30 minutes  Taner Rzepka Barrientos PA-C

## 2024-02-14 NOTE — Progress Notes (Signed)
 [facility]- General Surgery  SURGICAL PROGRESS NOTE  Hospital Day(s): 6.   Interval History:  Patient seen and examined. Patient reports pain has improved. Still having suprapubic pain when urinating, otherwise denies any pain.  Has a history of urinary hesitancy, and is now straining to urinate. Denies any blood in the urine.  He reports passing gas. Has been tolerating full liquid diet.  Denies any nausea or vomiting.   Vital signs in last 24 hours: [min-max] current  Temp:  [98.4 F (36.9 C)-98.8 F (37.1 C)] 98.6 F (37 C) (08/11 0748) Pulse Rate:  [65-75] 75 (08/11 0748) Resp:  [15-18] 18 (08/11 0748) BP: (98-124)/(66-85) 115/85 (08/11 0748) SpO2:  [95 %-96 %] 95 % (08/11 0748)     Height: 5' 7 (170.2 cm) (Simultaneous filing. User may not have seen previous data.) Weight: 76.2 kg (Simultaneous filing. User may not have seen previous data.) BMI (Calculated): 26.31   Intake/Output last 2 shifts:  08/10 0701 - 08/11 0700 In: 1011.7 [P.O.:712; IV Piggyback:299.7] Out: -    Physical Exam:  Constitutional: alert, cooperative and no distress  Respiratory: breathing non-labored at rest  Cardiovascular: regular rate and sinus rhythm  Gastrointestinal: soft, non-tender, and non-distended   Labs:     Latest Ref Rng & Units 02/13/2024    5:02 AM 02/12/2024    4:05 AM 02/11/2024    2:43 AM  CBC  WBC 4.0 - 10.5 K/uL 7.3  6.4  8.8   Hemoglobin 13.0 - 17.0 g/dL 87.2  87.0  87.7   Hematocrit 39.0 - 52.0 % 37.1  37.1  35.9   Platelets 150 - 400 K/uL 185  162  148       Latest Ref Rng & Units 02/13/2024    5:02 AM 02/12/2024    4:05 AM 02/11/2024    2:43 AM  CMP  Glucose 70 - 99 mg/dL 888  890  885   BUN 6 - 20 mg/dL 10  10  12    Creatinine 0.61 - 1.24 mg/dL 9.00  8.98  8.97   Sodium 135 - 145 mmol/L 138  137  136   Potassium 3.5 - 5.1 mmol/L 3.7  3.5  3.9   Chloride 98 - 111 mmol/L 99  103  102   CO2 22 - 32 mmol/L 28  24  26    Calcium  8.9 - 10.3 mg/dL 8.6  8.2  8.3      Imaging studies: No new pertinent imaging studies   Assessment/Plan:  63 y.o. male with acute complicated diverticulitis, Day 6.    - Stable vital signs  - Significant improvement with pain, denied any abdominal discomfort upon palpation  - Suprapubic pain- nearby fluid collection seen on imaging vs straining. May advise to see a urologist outpatient due to history of urinary hesitancy  - Has been tolerating full liquid diet, will advance to regular diet  - Continue IV Cipro  and Metro  - Continue pain management and DVT prophylaxis  - If patient continues to have no to minimal abdominal discomfort and tolerates regular diet, we will plan to discharge this afternoon or latest by tomorrow morning.   -- Gilmer Cea PA-C

## 2024-02-21 NOTE — Progress Notes (Signed)
 Subjective:   CC: Diverticulitis of large intestine with abscess without bleeding [K57.20]   HPI: History of Present Illness He is a 63 year old male who presents for a follow-up visit for diverticulitis.  He experiences soft bowel movements and has not resumed his regular diet.  Taking it slow on his own. He has no abdominal pain and no urinary symptoms anymore.     Current Medications: has a current medication list which includes the following prescription(s): allopurinol , amoxicillin , aspirin , atorvastatin , ciprofloxacin  hcl, diltiazem , indomethacin, metronidazole , trazodone , and terbinafine  hcl.  Allergies:  Allergies  Allergen Reactions  . Isosorbide Mononitrate Headache  . Oxycodone Anxiety and Itching    ROS: Pertinent positives and negatives noted in HPI    Objective:     Ht 170.2 cm (5' 7)   Wt 76.2 kg (168 lb)   BMI 26.31 kg/m   Constitutional :  Alert, no distress, cooperative  Gastrointestinal: soft, non-tender; bowel sounds normal; no masses,  no organomegaly.   Musculoskeletal: Steady gait and movement  Skin: Cool and moist  Psychiatric: Normal affect, non-agitated, not confused       LABS:  N/A   RADS: N/A  Assessment:      Diverticulitis of large intestine with abscess without bleeding [K57.20] S/p IV abx treatment  Plan:    Clinical exam and history reassuring.  Last cscope one year ago, clear.  Ok to f/u prn at this time.  labs/images/medications/previous chart entries reviewed personally and relevant changes/updates noted above.

## 2024-03-07 ENCOUNTER — Encounter: Payer: Self-pay | Admitting: Emergency Medicine

## 2024-03-07 ENCOUNTER — Ambulatory Visit: Admission: EM | Admit: 2024-03-07 | Discharge: 2024-03-07 | Disposition: A | Discharge status: Home / Self Care

## 2024-03-07 ENCOUNTER — Other Ambulatory Visit: Payer: Self-pay

## 2024-03-07 ENCOUNTER — Emergency Department

## 2024-03-07 ENCOUNTER — Inpatient Hospital Stay
Admission: EM | Admit: 2024-03-07 | Discharge: 2024-03-10 | DRG: 391 | Disposition: A | Discharge status: Home / Self Care | Source: Ambulatory Visit

## 2024-03-07 DIAGNOSIS — K611 Rectal abscess: Secondary | ICD-10-CM | POA: Diagnosis present

## 2024-03-07 DIAGNOSIS — K5720 Diverticulitis of large intestine with perforation and abscess without bleeding: Principal | ICD-10-CM | POA: Diagnosis present

## 2024-03-07 DIAGNOSIS — R748 Abnormal levels of other serum enzymes: Secondary | ICD-10-CM | POA: Insufficient documentation

## 2024-03-07 DIAGNOSIS — K630 Abscess of intestine: Principal | ICD-10-CM

## 2024-03-07 DIAGNOSIS — Z7982 Long term (current) use of aspirin: Secondary | ICD-10-CM | POA: Diagnosis not present

## 2024-03-07 DIAGNOSIS — Z79899 Other long term (current) drug therapy: Secondary | ICD-10-CM | POA: Diagnosis not present

## 2024-03-07 DIAGNOSIS — G4700 Insomnia, unspecified: Secondary | ICD-10-CM | POA: Diagnosis present

## 2024-03-07 DIAGNOSIS — K5792 Diverticulitis of intestine, part unspecified, without perforation or abscess without bleeding: Secondary | ICD-10-CM | POA: Diagnosis present

## 2024-03-07 DIAGNOSIS — K651 Peritoneal abscess: Secondary | ICD-10-CM | POA: Diagnosis present

## 2024-03-07 DIAGNOSIS — Z8719 Personal history of other diseases of the digestive system: Secondary | ICD-10-CM

## 2024-03-07 DIAGNOSIS — Z9049 Acquired absence of other specified parts of digestive tract: Secondary | ICD-10-CM | POA: Diagnosis not present

## 2024-03-07 DIAGNOSIS — E785 Hyperlipidemia, unspecified: Secondary | ICD-10-CM | POA: Diagnosis present

## 2024-03-07 DIAGNOSIS — Z8249 Family history of ischemic heart disease and other diseases of the circulatory system: Secondary | ICD-10-CM

## 2024-03-07 DIAGNOSIS — Z79891 Long term (current) use of opiate analgesic: Secondary | ICD-10-CM

## 2024-03-07 DIAGNOSIS — I700 Atherosclerosis of aorta: Secondary | ICD-10-CM | POA: Diagnosis present

## 2024-03-07 DIAGNOSIS — I10 Essential (primary) hypertension: Secondary | ICD-10-CM | POA: Diagnosis present

## 2024-03-07 DIAGNOSIS — R1030 Lower abdominal pain, unspecified: Secondary | ICD-10-CM | POA: Diagnosis not present

## 2024-03-07 DIAGNOSIS — N179 Acute kidney failure, unspecified: Secondary | ICD-10-CM | POA: Diagnosis present

## 2024-03-07 DIAGNOSIS — M109 Gout, unspecified: Secondary | ICD-10-CM | POA: Diagnosis present

## 2024-03-07 DIAGNOSIS — R7989 Other specified abnormal findings of blood chemistry: Secondary | ICD-10-CM | POA: Insufficient documentation

## 2024-03-07 DIAGNOSIS — K5780 Diverticulitis of intestine, part unspecified, with perforation and abscess without bleeding: Secondary | ICD-10-CM

## 2024-03-07 DIAGNOSIS — R509 Fever, unspecified: Secondary | ICD-10-CM

## 2024-03-07 DIAGNOSIS — Z888 Allergy status to other drugs, medicaments and biological substances status: Secondary | ICD-10-CM

## 2024-03-07 DIAGNOSIS — Z885 Allergy status to narcotic agent status: Secondary | ICD-10-CM

## 2024-03-07 HISTORY — DX: Diverticulitis of intestine, part unspecified, without perforation or abscess without bleeding: K57.92

## 2024-03-07 LAB — COMPREHENSIVE METABOLIC PANEL WITH GFR
ALT: 43 U/L (ref 0–44)
AST: 36 U/L (ref 15–41)
Albumin: 3.7 g/dL (ref 3.5–5.0)
Alkaline Phosphatase: 154 U/L — ABNORMAL HIGH (ref 38–126)
Anion gap: 10 (ref 5–15)
BUN: 20 mg/dL (ref 6–20)
CO2: 26 mmol/L (ref 22–32)
Calcium: 9.2 mg/dL (ref 8.9–10.3)
Chloride: 98 mmol/L (ref 98–111)
Creatinine, Ser: 1.26 mg/dL — ABNORMAL HIGH (ref 0.61–1.24)
GFR, Estimated: >60 mL/min (ref >60)
Glucose, Bld: 107 mg/dL — ABNORMAL HIGH (ref 70–99)
Potassium: 4.1 mmol/L (ref 3.5–5.1)
Sodium: 134 mmol/L — ABNORMAL LOW (ref 135–145)
Total Bilirubin: 1.3 mg/dL — ABNORMAL HIGH (ref 0.0–1.2)
Total Protein: 7.4 g/dL (ref 6.5–8.1)

## 2024-03-07 LAB — URINALYSIS, ROUTINE W REFLEX MICROSCOPIC
Bilirubin Urine: NEGATIVE
Glucose, UA: NEGATIVE mg/dL
Hgb urine dipstick: NEGATIVE
Ketones, ur: NEGATIVE mg/dL
Leukocytes,Ua: NEGATIVE
Nitrite: NEGATIVE
Protein, ur: NEGATIVE mg/dL
Specific Gravity, Urine: 1.033 — ABNORMAL HIGH (ref 1.005–1.030)
pH: 6 (ref 5.0–8.0)

## 2024-03-07 LAB — CBC
HCT: 43.5 % (ref 39.0–52.0)
Hemoglobin: 14.6 g/dL (ref 13.0–17.0)
MCH: 30.4 pg (ref 26.0–34.0)
MCHC: 33.6 g/dL (ref 30.0–36.0)
MCV: 90.6 fL (ref 80.0–100.0)
Platelets: 177 K/uL (ref 150–400)
RBC: 4.8 MIL/uL (ref 4.22–5.81)
RDW: 12.9 % (ref 11.5–15.5)
WBC: 10.1 K/uL (ref 4.0–10.5)
nRBC: 0 % (ref 0.0–0.2)

## 2024-03-07 LAB — LIPASE, BLOOD: Lipase: 39 U/L (ref 11–51)

## 2024-03-07 MED ORDER — IOHEXOL 300 MG/ML  SOLN
100.0000 mL | Freq: Once | INTRAMUSCULAR | Status: AC | PRN
  Administered 2024-03-07: 100 mL via INTRAVENOUS

## 2024-03-07 MED ORDER — ATORVASTATIN CALCIUM 20 MG PO TABS
20.0000 mg | ORAL_TABLET | Freq: Every day | ORAL | Status: DC
  Administered 2024-03-07 – 2024-03-09 (×3): 20 mg via ORAL
  Filled 2024-03-07 (×3): qty 1

## 2024-03-07 MED ORDER — ACETAMINOPHEN 650 MG RE SUPP: 650.0000 mg | Freq: Four times a day (QID) | RECTAL | Status: DC | PRN

## 2024-03-07 MED ORDER — LACTATED RINGERS IV BOLUS (SEPSIS)
1000.0000 mL | Freq: Once | INTRAVENOUS | Status: AC
  Administered 2024-03-07: 1000 mL via INTRAVENOUS

## 2024-03-07 MED ORDER — ONDANSETRON HCL 4 MG PO TABS
4.0000 mg | ORAL_TABLET | Freq: Four times a day (QID) | ORAL | Status: DC | PRN
Start: 2024-03-07 — End: 2024-03-12

## 2024-03-07 MED ORDER — TRAZODONE HCL 100 MG PO TABS: 100.0000 mg | ORAL_TABLET | Freq: Every evening | ORAL | Status: DC | PRN

## 2024-03-07 MED ORDER — SODIUM CHLORIDE 0.9 % IV SOLN
2.0000 g | Freq: Two times a day (BID) | INTRAVENOUS | Status: DC
  Administered 2024-03-07 – 2024-03-08 (×2): 2 g via INTRAVENOUS
  Filled 2024-03-07 (×2): qty 12.5

## 2024-03-07 MED ORDER — ACETAMINOPHEN 325 MG PO TABS
650.0000 mg | ORAL_TABLET | Freq: Four times a day (QID) | ORAL | Status: DC | PRN
Start: 2024-03-07 — End: 2024-03-12
  Administered 2024-03-08 – 2024-03-09 (×3): 650 mg via ORAL
  Filled 2024-03-07 (×3): qty 2

## 2024-03-07 MED ORDER — SODIUM CHLORIDE 0.9 % IV SOLN
2.0000 g | Freq: Once | INTRAVENOUS | Status: DC
  Filled 2024-03-07: qty 12.5

## 2024-03-07 MED ORDER — TRAZODONE HCL 100 MG PO TABS
100.0000 mg | ORAL_TABLET | Freq: Every day | ORAL | Status: DC
  Administered 2024-03-07 – 2024-03-09 (×3): 100 mg via ORAL
  Filled 2024-03-07 (×3): qty 1

## 2024-03-07 MED ORDER — METRONIDAZOLE 500 MG/100ML IV SOLN
500.0000 mg | Freq: Two times a day (BID) | INTRAVENOUS | Status: DC
  Administered 2024-03-08 – 2024-03-10 (×5): 500 mg via INTRAVENOUS
  Filled 2024-03-07 (×6): qty 100

## 2024-03-07 MED ORDER — ALLOPURINOL 100 MG PO TABS
100.0000 mg | ORAL_TABLET | Freq: Every day | ORAL | Status: DC
  Administered 2024-03-07 – 2024-03-09 (×3): 100 mg via ORAL
  Filled 2024-03-07 (×3): qty 1

## 2024-03-07 MED ORDER — HYDRALAZINE HCL 20 MG/ML IJ SOLN: 5.0000 mg | Freq: Four times a day (QID) | INTRAMUSCULAR | Status: DC | PRN

## 2024-03-07 MED ORDER — MORPHINE SULFATE (PF) 2 MG/ML IV SOLN
2.0000 mg | INTRAVENOUS | Status: AC | PRN
  Administered 2024-03-08: 2 mg via INTRAVENOUS
  Filled 2024-03-07: qty 1

## 2024-03-07 MED ORDER — ONDANSETRON HCL 4 MG/2ML IJ SOLN: 4.0000 mg | Freq: Four times a day (QID) | INTRAMUSCULAR | Status: DC | PRN

## 2024-03-07 MED ORDER — LACTATED RINGERS IV SOLN
150.0000 mL/h | INTRAVENOUS | Status: DC
  Administered 2024-03-07 – 2024-03-08 (×3): 150 mL/h via INTRAVENOUS

## 2024-03-07 MED ORDER — DILTIAZEM HCL 60 MG PO TABS: 120.0000 mg | ORAL_TABLET | Freq: Every day | ORAL | Status: DC

## 2024-03-07 MED ORDER — DILTIAZEM HCL 60 MG PO TABS
120.0000 mg | ORAL_TABLET | Freq: Every day | ORAL | Status: DC
  Administered 2024-03-08 – 2024-03-09 (×2): 120 mg via ORAL
  Filled 2024-03-07 (×2): qty 2

## 2024-03-07 MED ORDER — METRONIDAZOLE 500 MG/100ML IV SOLN
500.0000 mg | Freq: Once | INTRAVENOUS | Status: AC
  Administered 2024-03-07: 500 mg via INTRAVENOUS
  Filled 2024-03-07: qty 100

## 2024-03-07 MED ORDER — MORPHINE SULFATE (PF) 4 MG/ML IV SOLN: 4.0000 mg | INTRAVENOUS | Status: AC | PRN

## 2024-03-07 MED ORDER — ACETAMINOPHEN 500 MG PO TABS
1000.0000 mg | ORAL_TABLET | Freq: Once | ORAL | Status: AC
  Administered 2024-03-07: 1000 mg via ORAL
  Filled 2024-03-07: qty 2

## 2024-03-07 NOTE — ED Provider Notes (Signed)
 [facility] Emergency Department Provider Note     Event Date/Time   First MD Initiated Contact with Patient 03/07/24 1343     (approximate)   History   Abdominal Pain   HPI  Wilburn Keir is a 63 y.o. male with a history of diverticulosis, presents to the ED for lower abdominal discomfort x 4 days.  Patient denies any associated nausea, vomiting, or diarrhea.  He localizes pain to the right lower abdominopelvic region.  Patient had recent admission (02/08/24) for nonsurgical management of his acute diverticulitis.  He tolerated the IV medication and was discharged home with p.o. meds.  He had a follow-up visit with Dr. Tye in the clinic 2 weeks ago and had been managing fine.  He returns to the ED today, noting onset since Thursday, of this new right quadrant pain.  He is also endorsing subjective fevers and chills.  Patient denies any anorexia.  He reports his last meaningful bowel movement was probably a week ago.  Since that time he has only had passage of some soft scant stool, despite urge and some rectal pressure.  Physical Exam   Triage Vital Signs: ED Triage Vitals  Encounter Vitals Group     BP 03/07/24 1300 113/73     Girls Systolic BP Percentile --      Girls Diastolic BP Percentile --      Boys Systolic BP Percentile --      Boys Diastolic BP Percentile --      Pulse Rate 03/07/24 1300 91     Resp 03/07/24 1300 17     Temp 03/07/24 1300 98.3 F (36.8 C)     Temp Source 03/07/24 1300 Oral     SpO2 03/07/24 1300 97 %     Weight 03/07/24 1301 162 lb (73.5 kg)     Height 03/07/24 1301 5' 7 (1.702 m)     Head Circumference --      Peak Flow --      Pain Score 03/07/24 1301 5     Pain Loc --      Pain Education --      Exclude from Growth Chart --     Most recent vital signs: Vitals:   03/07/24 1351 03/07/24 1610  BP:    Pulse: 79   Resp:    Temp: 99.1 F (37.3 C) (!) 100.4 F (38 C)  SpO2:      General Awake, no distress.  NAD. chills HEENT NCAT. PERRL. EOMI. No rhinorrhea. Mucous membranes are moist.  CV:  Good peripheral perfusion. RRR RESP:  Normal effort.  CTA ABD:  No distention.  Soft and tender to palpation to the right lower quadrant and the suprapubic region.  Normal rectal tone on DRE.  Gross exam reveals a soft, flat external hemorrhoid.  No evidence of sinus tract, local erythema, pointing, fluctuance.    ED Results / Procedures / Treatments   Labs (all labs ordered are listed, but only abnormal results are displayed) Labs Reviewed  COMPREHENSIVE METABOLIC PANEL WITH GFR - Abnormal; Notable for the following components:      Result Value   Sodium 134 (*)    Glucose, Bld 107 (*)    Creatinine, Ser 1.26 (*)    Alkaline Phosphatase 154 (*)    Total Bilirubin 1.3 (*)    All other components within normal limits  URINALYSIS, ROUTINE W REFLEX MICROSCOPIC - Abnormal; Notable for the following components:   Color, Urine YELLOW (*)  APPearance CLEAR (*)    Specific Gravity, Urine 1.033 (*)    All other components within normal limits  LIPASE, BLOOD  CBC   EKG   RADIOLOGY  I personally viewed and evaluated these images as part of my medical decision making, as well as reviewing the written report by the radiologist.  ED Provider Interpretation: Upper rectal abscess + rectosigmoid abscess likely communicating  CT ABDOMEN PELVIS W CONTRAST Result Date: 03/07/2024 CLINICAL DATA:  The lower abdominal pain with fevers. History of diverticulitis small diverticular abscess shown on CT 02/11/2024 EXAM: CT ABDOMEN AND PELVIS WITH CONTRAST TECHNIQUE: Multidetector CT imaging of the abdomen and pelvis was performed using the standard protocol following bolus administration of intravenous contrast. RADIATION DOSE REDUCTION: This exam was performed according to the departmental dose-optimization program which includes automated exposure control, adjustment of the mA and/or kV according to patient size and/or  use of iterative reconstruction technique. CONTRAST:  OMNIPAQUE  IOHEXOL  300 MG/ML  SOLN COMPARISON:  02/11/2024 FINDINGS: Lower chest: Volume loss along the right hemidiaphragm compatible with atelectasis or scarring. The degree of atelectasis is reduced compared 02/11/2024. Mild scarring or atelectasis posteriorly in the left lower lobe. Hepatobiliary: Cholecystectomy. Pancreas: Unremarkable Spleen: Unremarkable Adrenals/Urinary Tract: Left mid to lower kidney benign cyst unchanged. Otherwise unremarkable. Stomach/Bowel: Dominant partially intramural abscess of the upper rectum 5.9 by 3.9 by 3.7 cm (volume = 45 cm^3) on image 74 series 2, formerly along the serosal margin and formerly 5.6 by 2.1 by 3.3 cm (volume = 20 cm^3) on 02/11/2024. This likely connects to a more proximal abscess measuring 2.1 by 1.2 by 3.4 cm (volume = 4.5 cm^3) along the rectosigmoid junction on image 67 series 2. This previously measured 2.0 by 1.5 by 1.2 cm (volume = 1.9 cm^3). Vascular/Lymphatic: Atherosclerosis is present, including aortoiliac atherosclerotic disease. Reproductive: Unremarkable Other: No supplemental non-categorized findings. Musculoskeletal: Schmorl's nodes along the superior endplates at L1 and L2. IMPRESSION: 1. Likely connecting abscesses in the perirectal regions/rectosigmoid junction have just over doubled in volume compared to 02/11/2024. 2. Aortic Atherosclerosis (ICD10-I70.0). Electronically Signed   By: Ryan Salvage M.D.   On: 03/07/2024 16:53    PROCEDURES:  Critical Care performed: No  Procedures   MEDICATIONS ORDERED IN ED: Medications  metroNIDAZOLE  (FLAGYL ) IVPB 500 mg (has no administration in time range)  ceFEPIme  (MAXIPIME ) 2 g in sodium chloride  0.9 % 100 mL IVPB (has no administration in time range)  iohexol  (OMNIPAQUE ) 300 MG/ML solution 100 mL (100 mLs Intravenous Contrast Given 03/07/24 1510)  acetaminophen  (TYLENOL ) tablet 1,000 mg (1,000 mg Oral Given 03/07/24 1612)      IMPRESSION / MDM / ASSESSMENT AND PLAN / ED COURSE  I reviewed the triage vital signs and the nursing notes.                              Differential diagnosis includes, but is not limited to, acute appendicitis, renal colic, testicular torsion, urinary tract infection/pyelonephritis, prostatitis,  epididymitis, diverticulitis, small bowel obstruction or ileus, colitis, abdominal aortic aneurysm, gastroenteritis, hernia, etc.  Patient's presentation is most consistent with acute complicated illness / injury requiring diagnostic workup.  ----------------------------------------- 5:29 PM on 03/07/2024 ----------------------------------------- Dr. Jordis at bedside to evaluate patient.  Hospitalist has been consulted for admission.  ----------------------------------------- 5:52 PM on 03/07/2024 ----------------------------------------- Spoke with Dr. Greig Free (hospitalist): She will admit the patient to the hospital service as expressed.  Surgical consult has been placed and she  will continue medical management pending surgical versus IR intervention.  Patient's diagnosis is consistent with acute rectal sigmoid and proximal rectal abscesses likely secondary to recent diverticulitis.  Patient presents in no acute distress, with subjective fevers and chills.  Vital signs are stable except he noted to have uptrending temperature in the ED.  Exam showed some tenderness to the lower abdominal pelvic region.  Rectal exam was otherwise benign.  CT imaging interpreted by me, shows 2 progressively enlarged abscesses when compared to CT scan from 2 weeks prior.  General surgery was consulted, and advised patient to be admitted to the hospital service for medical management.  Patient and his family member at bedside are made aware of the CT report findings, and the recommendation for admission and ongoing medical management.  Patient is agreeable to the plan at this time.   Clinical Course as of  03/07/24 1737  Tue Mar 07, 2024  1719 CT exam shows 2 distinct abscesses, likely communicating, between the upper rectum and the rectosigmoid junction, respectively. [JM]  1730 Dr. Jordis has been consulted regarding the case.  He is at bedside to evaluate the patient.  Hospitalist has been consulted. [JM]    Clinical Course User Index [JM] Teirra Carapia, Candida LULLA Kings, PA-C    FINAL CLINICAL IMPRESSION(S) / ED DIAGNOSES   Final diagnoses:  Abscess of sigmoid colon     Rx / DC Orders   ED Discharge Orders     None        Note:  This document was prepared using Dragon voice recognition software and may include unintentional dictation errors.    Loyd Candida LULLA Kings, PA-C 03/07/24 1753    Jacolyn Pae, MD 03/08/24 [phone]

## 2024-03-07 NOTE — Hospital Course (Signed)
 Erik Carpenter is a 63 year old male with history of hyperlipidemia, insomnia, who presents emergency department for chief concerns of abdominal pain, fever.  Vitals in the ED showed t 100.4, rr of 17, heart rate 79, blood pressure 113/73, SpO2 97% on room air.  Serum sodium is 134, potassium 4.1, chloride 98, bicarb 26, BUN of 20, serum creatinine of 1.26, EGFR greater than 60, nonfasting blood glucose 107, WBC 10.1, hemoglobin 14.6, platelets of 177.  UA was negative for leukocytes and nitrates.  ED treatment: Metronidazole , cefepime , azithromycin 1000 mg p.o. one-time dose

## 2024-03-07 NOTE — Assessment & Plan Note (Signed)
 With abscess General Surgery has been consulted and recommends admission for IV antibiotic and IR consultation IR has been consulted for aspiration collection and possible drain placement Symptomatic support: Morphine  2 mg IV every 4 hours as needed for moderate pain, morphine  4 mg IV every 4 hours as needed for severe pain, 1 day ordered LR 1 L bolus ordered on admission Continue with LR infusion at 150 mL/h, 20 hours ordered

## 2024-03-07 NOTE — Discharge Instructions (Signed)
 Go to the emergency department for evaluation of your abdominal pain and fever with your recent history of hospitalization for acute diverticulitis.

## 2024-03-07 NOTE — Consult Note (Addendum)
 Pharmacy Antibiotic Note  Caeden Foots is a 63 y.o. male admitted on 03/07/2024 with rectosigmoid abscess.  Pharmacy has been consulted for Cefepime  dosing.  Plan: The dose of Cefepime  will be adjusted to q12h based on renal function.  Height: 5' 7 (170.2 cm) Weight: 73.5 kg (162 lb) IBW/kg (Calculated) : 66.1  Temp (24hrs), Avg:99.1 F (37.3 C), Min:98.3 F (36.8 C), Max:100.4 F (38 C)  Recent Labs  Lab 03/07/24 1303  WBC 10.1  CREATININE 1.26*    Estimated Creatinine Clearance: 58.3 mL/min (A) (by C-G formula based on SCr of 1.26 mg/dL (H)).    Allergies  Allergen Reactions   Oxycodone Itching    Antimicrobials this admission: 0902 Metronidazole  500mg  q12h >>  0902 Cefepime  2G q12h  >>   Microbiology results: 0902 BCx: pending  Thank you for allowing pharmacy to be a part of this patient's care.  Annabella LOISE Banks 03/07/2024 6:00 PM

## 2024-03-07 NOTE — Assessment & Plan Note (Signed)
 Home allopurinol  100 mg nightly resumed

## 2024-03-07 NOTE — Assessment & Plan Note (Signed)
Home atorvastatin 20 mg nightly resumed

## 2024-03-07 NOTE — Consult Note (Signed)
 Chief Complaint: Patient was seen in consultation today for acute diverticulitis with abscess formation, with consideration for drainage.  Referring Provider(s): Dr. Greig Free, MD  Supervising Physician: Philip Cornet  Patient Status: [facility] - Out-pt  Patient is Full Code  History of Present Illness: Erik Carpenter is a 63 y.o. male  with PMHx notable for diverticulitis, HLD, and insomnia.  Per Dr. Stacy H&P on 9/2: [...]Acute diverticulitis With abscess General Surgery has been consulted and recommends admission for IV antibiotic and IR consultation IR has been consulted for aspiration collection and possible drain placement Symptomatic support: Morphine  2 mg IV every 4 hours as needed for moderate pain, morphine  4 mg IV every 4 hours as needed for severe pain, 1 day ordered LR 1 L bolus ordered on admission Continue with LR infusion at 150 mL/h, 20 hours ordered   Interventional Radiology was requested for diverticular abscess aspiration and possible drainage. Request was reviewed and approved by Dr. Philip. Patient is tentatively scheduled for same in IR today.   Patient is alert and laying in bed, calm. His daughter a family friend are at the bedside. Patient is currently complaining of mild lower abdominal pain. Patient denies any fevers, headache, chest pain, SOB, cough, nausea, vomiting or bleeding.    Past Medical History:  Diagnosis Date   Diverticulitis     Past Surgical History:  Procedure Laterality Date   TONSILLECTOMY     VASECTOMY      Allergies: Isosorbide nitrate, Oxycodone, and Oxycodone hcl  Medications: Prior to Admission medications   Medication Sig Start Date End Date Taking? Authorizing Provider  allopurinol  (ZYLOPRIM ) 100 MG tablet Take 200 mg by mouth daily. 12/15/23  Yes [provider]  aspirin  EC 81 MG tablet Take 81 mg by mouth once. 12/15/23  Yes [provider]  atorvastatin  (LIPITOR) 20 MG tablet Take 20 mg by mouth.   TAKE ONE TABLET BY MOUTH AT BEDTIME FOR CHOLESTEROL 12/15/23  Yes [provider]  diltiazem  (DILACOR XR ) 120 MG 24 hr capsule Take 120 mg by mouth.  TAKE ONE CAPSULE BY MOUTH EVERY DAY THIS REPLACES METOPROLOL 12/15/23  Yes [provider]  indomethacin (INDOCIN) 25 MG capsule Take 25 mg by mouth 2 (two) times daily with a meal.   Yes [provider]  terbinafine  (LAMISIL ) 250 MG tablet Take 250 mg by mouth.  TAKE ONE TABLET BY MOUTH EVERY DAY 12/15/23  Yes [provider]  traZODone  (DESYREL ) 100 MG tablet Take 100 mg by mouth.  TAKE ONE TABLET BY MOUTH AT BEDTIME FOR SLEEP 11/22/23  Yes [provider]  amoxicillin  (AMOXIL ) 500 MG capsule Take 500 mg by mouth 3 (three) times daily. Patient not taking: Reported on 03/07/2024 03/31/23   [provider]     Family History  Problem Relation Age of Onset   Congestive Heart Failure Mother     Social History   Socioeconomic History   Marital status: Married    Spouse name: Not on file   Number of children: Not on file   Years of education: Not on file   Highest education level: Not on file  Occupational History   Not on file  Tobacco Use   Smoking status: Never   Smokeless tobacco: Never  Substance and Sexual Activity   Alcohol use: Not Currently   Drug use: Not Currently   Sexual activity: Not Currently  Other Topics Concern   Not on file  Social History Narrative  Not on file   Social Drivers of Health   Financial Resource Strain: Low Risk  (02/21/2024)   Received from [facility]   Overall Financial Resource Strain (CARDIA)    Difficulty of Paying Living Expenses: Not hard at all  Food Insecurity: No Food Insecurity (03/07/2024)   Hunger Vital Sign    Worried About Running Out of Food in the Last Year: Never true    Ran Out of Food in the Last Year: Never true  Transportation Needs: No Transportation Needs (03/07/2024)   PRAPARE - Scientist, research (physical sciences) (Medical): No    Lack of Transportation (Non-Medical): No  Physical Activity: Not on file  Stress: Not on file  Social Connections: Not on file     Review of Systems: A 12 point ROS discussed and pertinent positives are indicated in the HPI above.  All other systems are negative.  Vital Signs: BP 110/80   Pulse 87   Temp 98.8 F (37.1 C)   Resp 16   Ht 5' 7 (1.702 m)   Wt 162 lb (73.5 kg)   SpO2 96%   BMI 25.37 kg/m   Advance Care Plan: The advanced care place/surrogate decision maker was discussed at the time of visit and the patient did not wish to discuss or was not able to name a surrogate decision maker or provide an advance care plan.  Physical Exam Vitals reviewed.  Constitutional:      General: He is not in acute distress.    Appearance: Normal appearance.  HENT:     Mouth/Throat:     Mouth: Mucous membranes are dry.  Cardiovascular:     Rate and Rhythm: Normal rate and regular rhythm.  Pulmonary:     Effort: Pulmonary effort is normal.     Breath sounds: Normal breath sounds.  Abdominal:     General: Abdomen is flat.     Palpations: Abdomen is soft.     Tenderness: There is abdominal tenderness in the suprapubic area and left lower quadrant.  Musculoskeletal:        General: Normal range of motion.     Cervical back: Normal range of motion.  Skin:    General: Skin is warm and dry.  Neurological:     Mental Status: He is alert and oriented to person, place, and time.  Psychiatric:        Mood and Affect: Mood normal.        Behavior: Behavior normal.        Thought Content: Thought content normal.        Judgment: Judgment normal.     Imaging: CT ABDOMEN PELVIS W CONTRAST Result Date: 03/07/2024 CLINICAL DATA:  The lower abdominal pain with fevers. History of diverticulitis small diverticular abscess shown on CT 02/11/2024 EXAM: CT ABDOMEN AND PELVIS WITH CONTRAST TECHNIQUE: Multidetector CT imaging of the abdomen and pelvis was performed  using the standard protocol following bolus administration of intravenous contrast. RADIATION DOSE REDUCTION: This exam was performed according to the departmental dose-optimization program which includes automated exposure control, adjustment of the mA and/or kV according to patient size and/or use of iterative reconstruction technique. CONTRAST:  OMNIPAQUE  IOHEXOL  300 MG/ML  SOLN COMPARISON:  02/11/2024 FINDINGS: Lower chest: Volume loss along the right hemidiaphragm compatible with atelectasis or scarring. The degree of atelectasis is reduced compared 02/11/2024. Mild scarring or atelectasis posteriorly in the left lower lobe. Hepatobiliary: Cholecystectomy. Pancreas: Unremarkable Spleen: Unremarkable Adrenals/Urinary Tract: Left  mid to lower kidney benign cyst unchanged. Otherwise unremarkable. Stomach/Bowel: Dominant partially intramural abscess of the upper rectum 5.9 by 3.9 by 3.7 cm (volume = 45 cm^3) on image 74 series 2, formerly along the serosal margin and formerly 5.6 by 2.1 by 3.3 cm (volume = 20 cm^3) on 02/11/2024. This likely connects to a more proximal abscess measuring 2.1 by 1.2 by 3.4 cm (volume = 4.5 cm^3) along the rectosigmoid junction on image 67 series 2. This previously measured 2.0 by 1.5 by 1.2 cm (volume = 1.9 cm^3). Vascular/Lymphatic: Atherosclerosis is present, including aortoiliac atherosclerotic disease. Reproductive: Unremarkable Other: No supplemental non-categorized findings. Musculoskeletal: Schmorl's nodes along the superior endplates at L1 and L2. IMPRESSION: 1. Likely connecting abscesses in the perirectal regions/rectosigmoid junction have just over doubled in volume compared to 02/11/2024. 2. Aortic Atherosclerosis (ICD10-I70.0). Electronically Signed   By: Ryan Salvage M.D.   On: 03/07/2024 16:53   CT ABDOMEN PELVIS W CONTRAST Result Date: 02/11/2024 EXAM: CT ABDOMEN AND PELVIS WITH CONTRAST 02/11/2024 09:14:07 AM TECHNIQUE: CT of the abdomen and pelvis was  performed with the administration of intravenous contrast. Multiplanar reformatted images are provided for review. Automated exposure control, iterative reconstruction, and/or weight based adjustment of the mA/kV was utilized to reduce the radiation dose to as low as reasonably achievable. COMPARISON: CT of the abdomen and pelvis dated 02/08/2024. CLINICAL HISTORY: Diverticulitis, complication suspected. Re-eval known divert. FINDINGS: LOWER CHEST: There has been interval worsening of streaky vascular opacification/consolidation within the lower lobes bilaterally and there is worsening bilateral dependent atelectasis. There are new small bilateral pleural effusions, slightly larger on the right. LIVER: The patient is status post cholecystectomy. GALLBLADDER AND BILE DUCTS: Gallbladder is surgically absent. No biliary ductal dilatation. SPLEEN: No acute abnormality. PANCREAS: No acute abnormality. ADRENAL GLANDS: No acute abnormality. KIDNEYS, URETERS AND BLADDER: There is a small simple cyst within the lower pole of the left kidney, which does not require follow up. The kidneys are otherwise normal. No stones in the kidneys or ureters. No hydronephrosis. No perinephric or periureteral stranding. A Foley catheter is present. GI AND BOWEL: There is diffuse abnormal thickening of the wall of the sigmoid colon, which is contracted. There is adjacent soft tissue stranding. There is an inflamed diverticulum with a small diverticular abscess, measuring approximately 2 x 2 x 1 cm. PERITONEUM AND RETROPERITONEUM: No ascites. No free air. VASCULATURE: Aorta is normal in caliber. LYMPH NODES: No lymphadenopathy. REPRODUCTIVE ORGANS: No acute abnormality. BONES AND SOFT TISSUES: No acute osseous abnormality. No focal soft tissue abnormality. IMPRESSION: 1. Worsening bilateral lower lobe consolidation and dependent atelectasis compared to prior study. New small bilateral pleural effusions, slightly larger on the right. 2. Status  post cholecystectomy. 3. Diverticulitis in the sigmoid colon with a small diverticular abscess measuring approximately 2 x 2 x 1 cm. Electronically signed by: evalene coho 02/11/2024 09:55 AM EDT RP Workstation: [id number]   CT ABDOMEN PELVIS W CONTRAST Result Date: 02/08/2024 CLINICAL DATA:  Acute, non localized abdominal pain. EXAM: CT ABDOMEN AND PELVIS WITH CONTRAST TECHNIQUE: Multidetector CT imaging of the abdomen and pelvis was performed using the standard protocol following bolus administration of intravenous contrast. RADIATION DOSE REDUCTION: This exam was performed according to the departmental dose-optimization program which includes automated exposure control, adjustment of the mA and/or kV according to patient size and/or use of iterative reconstruction technique. CONTRAST:  OMNIPAQUE  IOHEXOL  300 MG/ML  SOLN COMPARISON:  Chest CTA dated 08/20/2023 FINDINGS: Lower chest: Normal-sized heart. Small amount of linear atelectasis/scarring  at both lung bases. Hepatobiliary: No focal liver abnormality is seen. Status post cholecystectomy. No biliary dilatation. Pancreas: Unremarkable. No pancreatic ductal dilatation or surrounding inflammatory changes. Spleen: Normal in size without focal abnormality. Adrenals/Urinary Tract: Small, simple appearing left renal cysts not needing imaging follow-up. Otherwise, unremarkable adrenal glands, kidneys and ureters. Foley catheter in the urinary bladder with no urine in the bladder. Stomach/Bowel: Mild sigmoid colon diverticulosis. Mild pericolonic soft tissue stranding in the region of the mid and distal sigmoid colon. There is a small, rim enhancing fluid collection containing a small amount of gas along the inferolateral aspect of the sigmoid colon and contiguous with the sigmoid colon. This measures 1.1 cm in maximum diameter. This is adjacent to a diverticulum containing high density material. Unremarkable stomach, small bowel and appendix.  Vascular/Lymphatic: Atheromatous arterial calcifications without aneurysm. No enlarged lymph nodes. Reproductive: Prostate is unremarkable. Other: No abdominal wall hernia or abnormality. No abdominopelvic ascites. Musculoskeletal: Mild lumbar and lower thoracic spine degenerative changes. IMPRESSION: Mild sigmoid colon diverticulitis with a 1.1 cm abscess along the inferolateral aspect of the distal sigmoid colon. Electronically Signed   By: Elspeth Bathe M.D.   On: 02/08/2024 12:40    Labs:  CBC: Recent Labs    02/11/24 0243 02/12/24 0405 02/13/24 0502 03/07/24 1303  WBC 8.8 6.4 7.3 10.1  HGB 12.2* 12.9* 12.7* 14.6  HCT 35.9* 37.1* 37.1* 43.5  PLT 148* 162 185 177    COAGS: No results for input(s): INR, APTT in the last 8760 hours.  BMP: Recent Labs    02/11/24 0243 02/12/24 0405 02/13/24 0502 03/07/24 1303  NA 136 137 138 134*  K 3.9 3.5 3.7 4.1  CL 102 103 99 98  CO2 26 24 28 26   GLUCOSE 114* 109* 111* 107*  BUN 12 10 10 20   CALCIUM  8.3* 8.2* 8.6* 9.2  CREATININE 1.02 1.01 0.99 1.26*  GFRNONAA >60 >60 >60 >60    LIVER FUNCTION TESTS: Recent Labs    02/08/24 0925 03/07/24 1303  BILITOT 1.9* 1.3*  AST 40 36  ALT 38 43  ALKPHOS 81 154*  PROT 6.9 7.4  ALBUMIN 4.0 3.7    TUMOR MARKERS: No results for input(s): AFPTM, CEA, CA199, CHROMGRNA in the last 8760 hours.  Assessment and Plan: Per Dr. Stacy H&P on 9/2: [...]Acute diverticulitis With abscess General Surgery has been consulted and recommends admission for IV antibiotic and IR consultation IR has been consulted for aspiration collection and possible drain placement  Patient presents for scheduled intra-abdominal fluid collection drainage in IR today.  Patient has been NPO since midnight.  All labs and medications are within acceptable parameters.  Patient has an allergy to codeine, with itching.  Risks and benefits discussed with the patient including bleeding, infection, damage to  adjacent structures, bowel perforation/fistula connection, and sepsis.  All of the patient's questions were answered, patient is agreeable to proceed. Consent signed and in chart.    Thank you for allowing our service to participate in Erik Carpenter 's care.  Electronically Signed: Carlin DELENA Griffon, PA-C   03/07/2024, 10:17 PM      I spent a total of 30 Minutes in face to face in clinical consultation, greater than 50% of which was counseling/coordinating care for acute diverticulitis with abscess formation, with consideration for drainage.

## 2024-03-07 NOTE — Assessment & Plan Note (Signed)
 Home trazodone  100 mg nightly resumed

## 2024-03-07 NOTE — ED Notes (Signed)
 Dr pabon in with pt and family

## 2024-03-07 NOTE — H&P (Addendum)
 History and Physical   Erik Carpenter FMW:[id number] DOB: 12-08-1960 DOA: 03/07/2024  PCP: [facility]  Patient coming from: Urgent care via POV  I have personally briefly reviewed patient's old medical records in [facility] EMR.  Chief Concern: Fever, abdominal pain  HPI: [name] is a 63 year old male with history of hyperlipidemia, insomnia, who presents emergency department for chief concerns of abdominal pain, fever.  Vitals in the ED showed t 100.4, rr of 17, heart rate 79, blood pressure 113/73, SpO2 97% on room air.  Serum sodium is 134, potassium 4.1, chloride 98, bicarb 26, BUN of 20, serum creatinine of 1.26, EGFR greater than 60, nonfasting blood glucose 107, WBC 10.1, hemoglobin 14.6, platelets of 177.  UA was negative for leukocytes and nitrates.  ED treatment: Metronidazole , cefepime , azithromycin 1000 mg p.o. one-time dose -------------------------------- At bedside, patient was able to tell me his first and last name, age, location, current calendar year.  He reports that his abdomen has started hurting since his discharge from the hospital previously.  However it got worse over since Thursday.  He endorses subjective fever however did not check his temperature.  He denies trauma to his person.  He denies dysuria, hematuria, diarrhea, chest pain, shortness of breath, nausea, vomiting, dysphagia, swelling of his lower extremities.  Social history: He lives at home. He denies tobacco, EtOH, recreational drug use.  ROS: Constitutional: no weight change, no fever ENT/Mouth: no sore throat, no rhinorrhea Eyes: no eye pain, no vision changes Cardiovascular: no chest pain, no dyspnea,  no edema, no palpitations Respiratory: no cough, no sputum, no wheezing Gastrointestinal: no nausea, no vomiting, no diarrhea, no constipation Genitourinary: no urinary incontinence, no dysuria, no hematuria Musculoskeletal: no arthralgias, no myalgias Skin: no skin  lesions, no pruritus, Neuro: + weakness, no loss of consciousness, no syncope Psych: no anxiety, no depression, + decrease appetite Heme/Lymph: no bruising, no bleeding  ED Course: Discussed with EDP, patient requiring hospitalization for chief concerns of connecting abscesses in the perirectal region/rectosigmoid junction.  Assessment/Plan  Principal Problem:   Acute diverticulitis Active Problems:   Rectal abscess   Elevated serum creatinine   Elevated alkaline phosphatase level   History of diverticulitis   Abscess of sigmoid colon   Essential hypertension   Hyperlipidemia   Insomnia   Gout   Assessment and Plan:  * Acute diverticulitis With abscess General Surgery has been consulted and recommends admission for IV antibiotic and IR consultation IR has been consulted for aspiration collection and possible drain placement Symptomatic support: Morphine  2 mg IV every 4 hours as needed for moderate pain, morphine  4 mg IV every 4 hours as needed for severe pain, 1 day ordered LR 1 L bolus ordered on admission Continue with LR infusion at 150 mL/h, 20 hours ordered  Gout Home allopurinol  100 mg nightly resumed  Insomnia Home trazodone  100 mg nightly resumed  Hyperlipidemia Home atorvastatin  20 mg nightly resumed  Essential hypertension Home diltiazem  120 mg nightly resumed for 03/08/2024 as patient is currently low normotensive on admission Hydralazine  5 mg IV every 6 hours as needed for SBP greater 170, 5 days ordered  Elevated serum creatinine LR 1 L bolus, LR infusion at 150 mL/h, 1 day ordered Avoid nephrotoxic agents Recheck BMP in a.m.  Rectal abscess General surgery consulted, recommended IR consultation IR has been consulted  Chart reviewed.   DVT prophylaxis: Pharmacologic DVT not initiated on admission.  AM team to initiate pharmacologic DVT when the benefits outweigh the risk.\  Code Status: Full code Diet: N.p.o. pending general surgery  evaluation Family Communication: Updated daughter and best friend at bedside with patient's permission Disposition Plan: Pending clinical course Consults called: General Surgery Admission status: Telemetry surgical, inpatient  Past Medical History:  Diagnosis Date   Diverticulitis    Past Surgical History:  Procedure Laterality Date   TONSILLECTOMY     VASECTOMY     Social History:  reports that he has never smoked. He has never used smokeless tobacco. He reports that he does not currently use alcohol. He reports that he does not currently use drugs.  Allergies  Allergen Reactions   Isosorbide Nitrate Other (See Comments)   Oxycodone Itching   Oxycodone Hcl Other (See Comments)   Family History  Problem Relation Age of Onset   Congestive Heart Failure Mother    Family history: Family history reviewed and not pertinent.  Prior to Admission medications   Medication Sig Start Date End Date Taking? Authorizing Provider  allopurinol  (ZYLOPRIM ) 100 MG tablet Take 200 mg by mouth daily. 12/15/23  Yes [provider]  amoxicillin  (AMOXIL ) 500 MG capsule Take 500 mg by mouth 3 (three) times daily. 03/31/23  Yes [provider]  aspirin  EC 81 MG tablet Take 81 mg by mouth once. 12/15/23  Yes [provider]  indomethacin (INDOCIN) 25 MG capsule Take 25 mg by mouth 2 (two) times daily with a meal.   Yes [provider]  terbinafine  (LAMISIL ) 250 MG tablet Take 250 mg by mouth.  TAKE ONE TABLET BY MOUTH EVERY DAY 12/15/23  Yes [provider]  traZODone  (DESYREL ) 100 MG tablet Take 100 mg by mouth.  TAKE ONE TABLET BY MOUTH AT BEDTIME FOR SLEEP 11/22/23  Yes [provider]  atorvastatin  (LIPITOR) 20 MG tablet Take 20 mg by mouth.  TAKE ONE TABLET BY MOUTH AT BEDTIME FOR CHOLESTEROL 12/15/23   [provider]  diltiazem  (DILACOR XR ) 120 MG 24 hr capsule Take 120 mg by mouth.  TAKE ONE CAPSULE BY MOUTH EVERY DAY THIS REPLACES  METOPROLOL 12/15/23   [provider]   Physical Exam: Vitals:   03/07/24 1351 03/07/24 1610 03/07/24 1804 03/07/24 1957  BP:   118/75 110/80  Pulse: 79  92 87  Resp:   18 16  Temp: 99.1 F (37.3 C) (!) 100.4 F (38 C) 100.1 F (37.8 C) 98.8 F (37.1 C)  TempSrc: Oral Oral Oral   SpO2:   98% 96%  Weight:      Height:       Constitutional: appears age-appropriate, NAD, calm Eyes: PERRL, lids and conjunctivae normal ENMT: Mucous membranes are moist. Posterior pharynx clear of any exudate or lesions. Age-appropriate dentition. Hearing appropriate Neck: normal, supple, no masses, no thyromegaly Respiratory: clear to auscultation bilaterally, no wheezing, no crackles. Normal respiratory effort. No accessory muscle use.  Cardiovascular: Regular rate and rhythm, no murmurs / rubs / gallops. No extremity edema. 2+ pedal pulses. No carotid bruits.  Abdomen: no tenderness, no masses palpated, no hepatosplenomegaly. Bowel sounds positive.  Musculoskeletal: no clubbing / cyanosis. No joint deformity upper and lower extremities. Good ROM, no contractures, no atrophy. Normal muscle tone.  Skin: no rashes, lesions, ulcers. No induration Neurologic: Sensation intact. Strength 5/5 in all 4.  Psychiatric: Normal judgment and insight. Alert and oriented x 3. Normal mood.   EKG: Not indicated at this time  Chest x-ray on Admission: I personally reviewed and I agree with radiologist reading as below.  CT ABDOMEN  PELVIS W CONTRAST Result Date: 03/07/2024 CLINICAL DATA:  The lower abdominal pain with fevers. History of diverticulitis small diverticular abscess shown on CT 02/11/2024 EXAM: CT ABDOMEN AND PELVIS WITH CONTRAST TECHNIQUE: Multidetector CT imaging of the abdomen and pelvis was performed using the standard protocol following bolus administration of intravenous contrast. RADIATION DOSE REDUCTION: This exam was performed according to the departmental dose-optimization program which includes  automated exposure control, adjustment of the mA and/or kV according to patient size and/or use of iterative reconstruction technique. CONTRAST:  OMNIPAQUE  IOHEXOL  300 MG/ML  SOLN COMPARISON:  02/11/2024 FINDINGS: Lower chest: Volume loss along the right hemidiaphragm compatible with atelectasis or scarring. The degree of atelectasis is reduced compared 02/11/2024. Mild scarring or atelectasis posteriorly in the left lower lobe. Hepatobiliary: Cholecystectomy. Pancreas: Unremarkable Spleen: Unremarkable Adrenals/Urinary Tract: Left mid to lower kidney benign cyst unchanged. Otherwise unremarkable. Stomach/Bowel: Dominant partially intramural abscess of the upper rectum 5.9 by 3.9 by 3.7 cm (volume = 45 cm^3) on image 74 series 2, formerly along the serosal margin and formerly 5.6 by 2.1 by 3.3 cm (volume = 20 cm^3) on 02/11/2024. This likely connects to a more proximal abscess measuring 2.1 by 1.2 by 3.4 cm (volume = 4.5 cm^3) along the rectosigmoid junction on image 67 series 2. This previously measured 2.0 by 1.5 by 1.2 cm (volume = 1.9 cm^3). Vascular/Lymphatic: Atherosclerosis is present, including aortoiliac atherosclerotic disease. Reproductive: Unremarkable Other: No supplemental non-categorized findings. Musculoskeletal: Schmorl's nodes along the superior endplates at L1 and L2. IMPRESSION: 1. Likely connecting abscesses in the perirectal regions/rectosigmoid junction have just over doubled in volume compared to 02/11/2024. 2. Aortic Atherosclerosis (ICD10-I70.0). Electronically Signed   By: Ryan Salvage M.D.   On: 03/07/2024 16:53   Labs on Admission: I have personally reviewed following labs  CBC: Recent Labs  Lab 03/07/24 1303  WBC 10.1  HGB 14.6  HCT 43.5  MCV 90.6  PLT 177   Basic Metabolic Panel: Recent Labs  Lab 03/07/24 1303  NA 134*  K 4.1  CL 98  CO2 26  GLUCOSE 107*  BUN 20  CREATININE 1.26*  CALCIUM  9.2   GFR: Estimated Creatinine Clearance: 58.3 mL/min (A) (by C-G  formula based on SCr of 1.26 mg/dL (H)).  Liver Function Tests: Recent Labs  Lab 03/07/24 1303  AST 36  ALT 43  ALKPHOS 154*  BILITOT 1.3*  PROT 7.4  ALBUMIN 3.7   Recent Labs  Lab 03/07/24 1303  LIPASE 39   Urine analysis:    Component Value Date/Time   COLORURINE YELLOW (A) 03/07/2024 1600   APPEARANCEUR CLEAR (A) 03/07/2024 1600   LABSPEC 1.033 (H) 03/07/2024 1600   PHURINE 6.0 03/07/2024 1600   GLUCOSEU NEGATIVE 03/07/2024 1600   HGBUR NEGATIVE 03/07/2024 1600   BILIRUBINUR NEGATIVE 03/07/2024 1600   KETONESUR NEGATIVE 03/07/2024 1600   PROTEINUR NEGATIVE 03/07/2024 1600   NITRITE NEGATIVE 03/07/2024 1600   LEUKOCYTESUR NEGATIVE 03/07/2024 1600   This document was prepared using Dragon Voice Recognition software and may include unintentional dictation errors.  Dr. Sherre [facility]  If 7PM-7AM, please contact overnight-coverage provider If 7AM-7PM, please contact day attending provider [email or web address]  03/07/2024, 8:41 PM

## 2024-03-07 NOTE — Assessment & Plan Note (Addendum)
 LR 1 L bolus, LR infusion at 150 mL/h, 1 day ordered Avoid nephrotoxic agents Recheck BMP in a.m.

## 2024-03-07 NOTE — Consult Note (Signed)
 CODE SEPSIS - PHARMACY COMMUNICATION  **Broad Spectrum Antibiotics should be administered within 1 hour of Sepsis diagnosis**  Time Code Sepsis Called/Page Received: 1745  Antibiotics Ordered:  Flagyl  500 mg x1, Cefepime  2G   Time of 1st antibiotic administration: 1745  Additional action taken by pharmacy: none   Annabella LOISE Banks ,PharmD Clinical Pharmacist  03/07/2024  5:56 PM

## 2024-03-07 NOTE — ED Triage Notes (Signed)
 Patient to ED via POV for lower abd pain. Denies N/V/D. States fevers at home but did not check with thermometer. Recently dc'd from hospital with diverticulitis.

## 2024-03-07 NOTE — ED Notes (Signed)
 See triage note Presents with some abd pain  States pain is lower abd Has a hx of diverticulitis  Subjective fever  low grade on arrival

## 2024-03-07 NOTE — Consult Note (Signed)
 Patient ID: Erik Carpenter, male   DOB: 05/10/1961, 63 y.o.   MRN: [id number]  HPI Beaux Wedemeyer is a 63 y.o. male seen in consultation at the request of Dr. Sherre.  He does have about a 1 week history of failure to thrive fevers chills and suprapubic pain.  He reports that the pain is intermittent moderate intensity.  He does report chills and fevers.  Was recently hospitalized last month for acute diverticulitis with small abscess and was treated with antibiotics.  He was sent home on Cipro  and Flagyl  for additional 7 days.SABRA  He did see Dr. Tye before who is managing the antibiotic therapy. He definitely felt a change in his his condition for the last week or so. He did have a repeat CT scan personally reviewed and evidence of diverticulitis with rectosigmoid abscesses going to the pelvis.  There seems to be enlarged as compared to prior CT. Does have a normal CBC but a creatinine is 1.26 with a BUN of 20 consistent with acute kidney injury likely from dehydration  HPI  History reviewed. No pertinent past medical history.  Past Surgical History:  Procedure Laterality Date   TONSILLECTOMY     VASECTOMY      History reviewed. No pertinent family history.  Social History Social History   Tobacco Use   Smoking status: Never   Smokeless tobacco: Never    Allergies  Allergen Reactions   Oxycodone Itching    No current facility-administered medications for this encounter.   Current Outpatient Medications  Medication Sig Dispense Refill   allopurinol  (ZYLOPRIM ) 100 MG tablet Take 200 mg by mouth daily.     aspirin  EC 81 MG tablet Take 81 mg by mouth once.     atorvastatin  (LIPITOR) 20 MG tablet Take 20 mg by mouth.  TAKE ONE TABLET BY MOUTH AT BEDTIME FOR CHOLESTEROL     diltiazem  (DILACOR XR ) 120 MG 24 hr capsule Take 120 mg by mouth.  TAKE ONE CAPSULE BY MOUTH EVERY DAY THIS REPLACES METOPROLOL     terbinafine  (LAMISIL ) 250 MG tablet Take 250 mg by mouth.  TAKE ONE TABLET BY MOUTH  EVERY DAY     traZODone  (DESYREL ) 100 MG tablet Take 100 mg by mouth.  TAKE ONE TABLET BY MOUTH AT BEDTIME FOR SLEEP       Review of Systems Full ROS  was asked and was negative except for the information on the HPI  Physical Exam Blood pressure 113/73, pulse 79, temperature (!) 100.4 F (38 C), temperature source Oral, resp. rate 17, height 5' 7 (1.702 m), weight 73.5 kg, SpO2 97%. CONSTITUTIONAL: NAD. EYES: Pupils are equal, round, Sclera are non-icteric. EARS, NOSE, MOUTH AND THROAT: The oropharynx is clear. The oral mucosa is pink and moist. Hearing is intact to voice. LYMPH NODES:  Lymph nodes in the neck are normal. RESPIRATORY:  Lungs are clear. There is normal respiratory effort, with equal breath sounds bilaterally, and without pathologic use of accessory muscles. CARDIOVASCULAR: Heart is regular without murmurs, gallops, or rubs. GI: The abdomen is  soft, mild tenderness palpation in the suprapubic area and left lower quadrant without peritonitis there are no palpable masses. There is no hepatosplenomegaly. There are normal bowel sounds GU: Rectal deferred.   MUSCULOSKELETAL: Normal muscle strength and tone. No cyanosis or edema.   SKIN: Turgor is good and there are no pathologic skin lesions or ulcers. NEUROLOGIC: Motor and sensation is grossly normal. Cranial nerves are grossly intact. PSYCH:  Oriented to person,  place and time. Affect is normal.  Data Reviewed I have personally reviewed the patient's imaging, laboratory findings and medical records.    Assessment/Plan Acute diverticulitis w abscess that seems to have enlarged in size. Pt is not toxic nor peritonitis and does not need emergent surgical intervention but will definitely benefit from admission w antibiotics IV and IR consultation for potential drain vs aspiration of collection. I understand this is a tricky spot to drain but is worth seeking their expertise and opinion about this. Also did discuss the potential  for Holmer's procedure potential role for acute surgery if the reticulitis is not responsive to aggressive medical treatment.  I do think that eventually he will benefit from elective sigmoid colectomy.  Discussed with him in detail.  They understand. I personally spent a total of 75 minutes in the care of the patient today including performing a medically appropriate exam/evaluation, counseling and educating, placing orders, referring and communicating with other health care professionals, documenting clinical information in the EHR, independently interpreting and reviewing images studies and coordinating care.    Laneta Luna, MD FACS General Surgeon 03/07/2024, 5:18 PM

## 2024-03-07 NOTE — ED Triage Notes (Signed)
 Patient to Urgent Care with complaints of centralized abdominal pain, subjective fevers (waking up soaking wet w/ chills). Reports one good BM one week ago.   Symptoms x1 week. Recent diverticulitis (8/5).

## 2024-03-07 NOTE — ED Provider Notes (Signed)
 Erik Carpenter    CSN: [id number] Arrival date & time: 03/07/24  1209      History   Chief Complaint Chief Complaint  Patient presents with   Fever   Abdominal Pain    HPI Erik Carpenter is a 63 y.o. male.  Patient presents with 1 week history of fever, chills, lower abdominal pain.  No blood or mucus in stool.  He reports last full bowel movement was approximately 1 week ago.  He was recently hospitalized for diverticulitis.  Patient was admitted to [facility] on 02/08/2024 for acute diverticulitis; treated with IV antibiotics, IV fluids, pain control; discharged on Cipro  and Flagyl .  Patient was seen by surgeon Dr. Tye on 02/21/2024 with no further intervention needed at that time.    The history is provided by the patient and medical records.    History reviewed. No pertinent past medical history.  Patient Active Problem List   Diagnosis Date Noted   Pain of joint of left ankle and foot 02/12/2024   Acute diverticulitis 02/08/2024    Past Surgical History:  Procedure Laterality Date   TONSILLECTOMY     VASECTOMY         Home Medications    Prior to Admission medications   Medication Sig Start Date End Date Taking? Authorizing Provider  allopurinol  (ZYLOPRIM ) 100 MG tablet Take 200 mg by mouth daily. 12/15/23   [provider]  aspirin  EC 81 MG tablet Take 81 mg by mouth once. 12/15/23   [provider]  atorvastatin  (LIPITOR) 20 MG tablet Take 20 mg by mouth.  TAKE ONE TABLET BY MOUTH AT BEDTIME FOR CHOLESTEROL 12/15/23   [provider]  diltiazem  (DILACOR XR ) 120 MG 24 hr capsule Take 120 mg by mouth.  TAKE ONE CAPSULE BY MOUTH EVERY DAY THIS REPLACES METOPROLOL 12/15/23   [provider]  terbinafine  (LAMISIL ) 250 MG tablet Take 250 mg by mouth.  TAKE ONE TABLET BY MOUTH EVERY DAY 12/15/23   [provider]  traZODone  (DESYREL ) 100 MG tablet Take 100 mg by mouth.  TAKE ONE TABLET BY MOUTH AT  BEDTIME FOR SLEEP 11/22/23   [provider]    Family History History reviewed. No pertinent family history.  Social History Social History   Tobacco Use   Smoking status: Never   Smokeless tobacco: Never     Allergies   Oxycodone   Review of Systems Review of Systems  Constitutional:  Positive for chills and fever.  Gastrointestinal:  Positive for abdominal pain and constipation. Negative for blood in stool and vomiting.  Genitourinary:  Negative for dysuria and hematuria.     Physical Exam Triage Vital Signs ED Triage Vitals  Encounter Vitals Group     BP 03/07/24 1223 101/69     Girls Systolic BP Percentile --      Girls Diastolic BP Percentile --      Boys Systolic BP Percentile --      Boys Diastolic BP Percentile --      Pulse Rate 03/07/24 1223 92     Resp 03/07/24 1223 19     Temp 03/07/24 1223 98.4 F (36.9 C)     Temp src --      SpO2 03/07/24 1223 97 %     Weight --      Height --      Head Circumference --      Peak Flow --      Pain Score 03/07/24 1215  4     Pain Loc --      Pain Education --      Exclude from Growth Chart --    No data found.  Updated Vital Signs BP 101/69   Pulse 92   Temp 98.4 F (36.9 C)   Resp 19   SpO2 97%   Visual Acuity Right Eye Distance:   Left Eye Distance:   Bilateral Distance:    Right Eye Near:   Left Eye Near:    Bilateral Near:     Physical Exam Constitutional:      General: He is not in acute distress.    Appearance: He is ill-appearing.  HENT:     Mouth/Throat:     Mouth: Mucous membranes are moist.  Cardiovascular:     Rate and Rhythm: Normal rate and regular rhythm.  Pulmonary:     Effort: Pulmonary effort is normal. No respiratory distress.  Abdominal:     General: Bowel sounds are normal.     Palpations: Abdomen is soft.     Tenderness: There is abdominal tenderness in the right lower quadrant and left lower quadrant. There is no right CVA tenderness, left CVA tenderness,  guarding or rebound.  Neurological:     Mental Status: He is alert.      UC Treatments / Results  Labs (all labs ordered are listed, but only abnormal results are displayed) Labs Reviewed - No data to display  EKG   Radiology No results found.  Procedures Procedures (including critical care time)  Medications Ordered in UC Medications - No data to display  Initial Impression / Assessment and Plan / UC Course  I have reviewed the triage vital signs and the nursing notes.  Pertinent labs & imaging results that were available during my care of the patient were reviewed by me and considered in my medical decision making (see chart for details).    Lower abdominal pain, fever, chills, recent hospitalization for diverticulitis.  Afebrile and vital signs are stable.  Patient is ill-appearing.  His abdomen is tender.  He was hospitalized in August for diverticulitis.  Sending him to the ED for evaluation.  He is agreeable to this and will go to [facility] ED.  He has a family member with him who will drive.  Final Clinical Impressions(s) / UC Diagnoses   Final diagnoses:  Lower abdominal pain  History of diverticulitis  Fever and chills     Discharge Instructions      Go to the emergency department for evaluation of your abdominal pain and fever with your recent history of hospitalization for acute diverticulitis.     ED Prescriptions   None    PDMP not reviewed this encounter.   Corlis Burnard DEL, NP 03/07/24 1242

## 2024-03-07 NOTE — Assessment & Plan Note (Signed)
 Home diltiazem  120 mg nightly resumed for 03/08/2024 as patient is currently low normotensive on admission Hydralazine  5 mg IV every 6 hours as needed for SBP greater 170, 5 days ordered

## 2024-03-07 NOTE — Assessment & Plan Note (Signed)
 General surgery consulted, recommended IR consultation IR has been consulted

## 2024-03-08 ENCOUNTER — Inpatient Hospital Stay

## 2024-03-08 DIAGNOSIS — K5792 Diverticulitis of intestine, part unspecified, without perforation or abscess without bleeding: Secondary | ICD-10-CM | POA: Diagnosis not present

## 2024-03-08 LAB — BASIC METABOLIC PANEL WITH GFR
Anion gap: 10 (ref 5–15)
BUN: 20 mg/dL (ref 6–20)
CO2: 24 mmol/L (ref 22–32)
Calcium: 8.5 mg/dL — ABNORMAL LOW (ref 8.9–10.3)
Chloride: 101 mmol/L (ref 98–111)
Creatinine, Ser: 0.94 mg/dL (ref 0.61–1.24)
GFR, Estimated: >60 mL/min (ref >60)
Glucose, Bld: 110 mg/dL — ABNORMAL HIGH (ref 70–99)
Potassium: 4 mmol/L (ref 3.5–5.1)
Sodium: 135 mmol/L (ref 135–145)

## 2024-03-08 LAB — CBC
HCT: 36.4 % — ABNORMAL LOW (ref 39.0–52.0)
Hemoglobin: 12.8 g/dL — ABNORMAL LOW (ref 13.0–17.0)
MCH: 30.8 pg (ref 26.0–34.0)
MCHC: 35.2 g/dL (ref 30.0–36.0)
MCV: 87.7 fL (ref 80.0–100.0)
Platelets: 162 K/uL (ref 150–400)
RBC: 4.15 MIL/uL — ABNORMAL LOW (ref 4.22–5.81)
RDW: 12.9 % (ref 11.5–15.5)
WBC: 9.6 K/uL (ref 4.0–10.5)
nRBC: 0 % (ref 0.0–0.2)

## 2024-03-08 MED ORDER — FENTANYL CITRATE (PF) 100 MCG/2ML IJ SOLN
INTRAMUSCULAR | Status: AC
Start: 2024-03-08 — End: 2024-03-08
  Filled 2024-03-08: qty 2

## 2024-03-08 MED ORDER — SODIUM CHLORIDE 0.9 % IV SOLN
2.0000 g | Freq: Three times a day (TID) | INTRAVENOUS | Status: DC
  Administered 2024-03-08 – 2024-03-10 (×6): 2 g via INTRAVENOUS
  Filled 2024-03-08 (×7): qty 12.5

## 2024-03-08 MED ORDER — MORPHINE SULFATE (PF) 2 MG/ML IV SOLN
2.0000 mg | INTRAVENOUS | Status: DC | PRN
  Administered 2024-03-08: 2 mg via INTRAVENOUS
  Filled 2024-03-08: qty 1

## 2024-03-08 MED ORDER — MIDAZOLAM HCL 2 MG/2ML IJ SOLN
INTRAMUSCULAR | Status: AC
Start: 2024-03-08 — End: 2024-03-08
  Filled 2024-03-08: qty 4

## 2024-03-08 MED ORDER — LACTATED RINGERS IV SOLN: INTRAVENOUS | Status: AC

## 2024-03-08 MED ORDER — MIDAZOLAM HCL 2 MG/2ML IJ SOLN
INTRAMUSCULAR | Status: AC | PRN
  Administered 2024-03-08: 1 mg via INTRAVENOUS

## 2024-03-08 MED ORDER — SODIUM CHLORIDE 0.9% FLUSH
5.0000 mL | Freq: Three times a day (TID) | INTRAVENOUS | Status: DC
Start: 2024-03-08 — End: 2024-03-10
  Administered 2024-03-08 – 2024-03-10 (×6): 5 mL

## 2024-03-08 MED ORDER — FENTANYL CITRATE (PF) 100 MCG/2ML IJ SOLN
INTRAMUSCULAR | Status: AC | PRN
  Administered 2024-03-08: 50 ug via INTRAVENOUS

## 2024-03-08 NOTE — Plan of Care (Signed)

## 2024-03-08 NOTE — Progress Notes (Signed)
 PROGRESS NOTE    Erik Carpenter  FMW:[id number] DOB: May 07, 1961 DOA: 03/07/2024 PCP: [facility]  Chief Complaint  Patient presents with   Abdominal Pain    Hospital Course:  [name] is a 63 year old male with history of hyperlipidemia, insomnia, who presents emergency department for chief concerns of abdominal pain, fever.  Admitted for acute diverticulitis with abscess.  Seen by general surgery, recommend drain placement by IR which is being placed today. Hospital course as below  Subjective: Patient was examined at the bedside, new to me today.  Daughter present at the bedside. Continues to have abdominal pain which is similar since presentation.  Awaiting drain placement by IR today   Objective: Vitals:   03/07/24 1804 03/07/24 1957 03/08/24 0500 03/08/24 0717  BP: 118/75 110/80 104/71 114/77  Pulse: 92 87 75 71  Resp: 18 16 16 18   Temp: 100.1 F (37.8 C) 98.8 F (37.1 C) 99 F (37.2 C) 98.2 F (36.8 C)  TempSrc: Oral  Oral   SpO2: 98% 96% 93% 96%  Weight:      Height:        Intake/Output Summary (Last 24 hours) at 03/08/2024 0847 Last data filed at 03/08/2024 9360 Gross per 24 hour  Intake 1957.18 ml  Output --  Net 1957.18 ml   Filed Weights   03/07/24 1301  Weight: 73.5 kg    Examination: Constitutional: appears age-appropriate, NAD, calm Neck: normal, supple, no masses, no thyromegaly Respiratory: clear to auscultation bilaterally, no wheezing, no crackles. Normal respiratory effort. No accessory muscle use.  Cardiovascular: Regular rate and rhythm, no murmurs / rubs / gallops. No extremity edema. 2+ pedal pulses. No carotid bruits.  Abdomen: TTP + suprapubic area, , no masses palpated, no hepatosplenomegaly. Bowel sounds positive.  Skin: no rashes, lesions, ulcers. No induration Neurologic: Sensation intact. Strength 5/5 in all 4.   Assessment & Plan:  Principal Problem:   Acute diverticulitis Active Problems:   Rectal abscess    Elevated serum creatinine   Elevated alkaline phosphatase level   History of diverticulitis   Abscess of sigmoid colon   Essential hypertension   Hyperlipidemia   Insomnia   Gout  Acute diverticulitis with abscess - CT with likely connecting abscesses in the perirectal region/rectosigmoid junction that have just over doubled in volume compared to 02/11/2024 - General Surgery has been consulted and recommends admission for IV antibiotic and IR consultation.  Eventually will benefit from elective sigmoid colectomy with primary anastomosis - IR has been consulted for aspiration collection and possible drain placement - On Cefepime , Flagyl  - NPO, IV fluids, Pain management with IV Morphine  - Follow blood cultures  Mild AKI - Resolved s/p IV fluids  Gout Home allopurinol  100 mg   Insomnia Home trazodone  100 mg   Hyperlipidemia Home atorvastatin  20 mg   Essential hypertension Home diltiazem  120 mg Hydralazine  5 mg IV every 6 hours as needed for SBP greater 170, 5 days ordered   DVT prophylaxis: SCD, will hold pharmacologic ppx until further plan   Code Status: Full Code Disposition:  Home  Consultants:  Treatment Team:  Consulting Physician: Tye Millet, DO General Surgery Interventional radiology  Procedures:  Drain placement by IR pending  Antimicrobials:  Anti-infectives (From admission, onward)    Start     Dose/Rate Route Frequency Ordered Stop   03/08/24 1400  ceFEPIme  (MAXIPIME ) 2 g in sodium chloride  0.9 % 100 mL IVPB        2 g 200 mL/hr over 30  Minutes Intravenous Every 8 hours 03/08/24 0839 03/14/24 1359   03/08/24 0600  metroNIDAZOLE  (FLAGYL ) IVPB 500 mg        500 mg 100 mL/hr over 60 Minutes Intravenous Every 12 hours 03/07/24 1745 03/15/24 0559   03/07/24 1815  ceFEPIme  (MAXIPIME ) 2 g in sodium chloride  0.9 % 100 mL IVPB  Status:  Discontinued        2 g 200 mL/hr over 30 Minutes Intravenous Every 12 hours 03/07/24 1759 03/08/24 0839   03/07/24  1730  metroNIDAZOLE  (FLAGYL ) IVPB 500 mg        500 mg 100 mL/hr over 60 Minutes Intravenous  Once 03/07/24 1722 03/07/24 1825   03/07/24 1730  ceFEPIme  (MAXIPIME ) 2 g in sodium chloride  0.9 % 100 mL IVPB  Status:  Discontinued        2 g 200 mL/hr over 30 Minutes Intravenous  Once 03/07/24 1725 03/07/24 1745       Data Reviewed: I have personally reviewed following labs and imaging studies CBC: Recent Labs  Lab 03/07/24 1303 03/08/24 0352  WBC 10.1 9.6  HGB 14.6 12.8*  HCT 43.5 36.4*  MCV 90.6 87.7  PLT 177 162   Basic Metabolic Panel: Recent Labs  Lab 03/07/24 1303 03/08/24 0352  NA 134* 135  K 4.1 4.0  CL 98 101  CO2 26 24  GLUCOSE 107* 110*  BUN 20 20  CREATININE 1.26* 0.94  CALCIUM  9.2 8.5*   GFR: Estimated Creatinine Clearance: 78.1 mL/min (by C-G formula based on SCr of 0.94 mg/dL). Liver Function Tests: Recent Labs  Lab 03/07/24 1303  AST 36  ALT 43  ALKPHOS 154*  BILITOT 1.3*  PROT 7.4  ALBUMIN 3.7   CBG: No results for input(s): GLUCAP in the last 168 hours.  No results found for this or any previous visit (from the past 240 hours).   Radiology Studies: CT ABDOMEN PELVIS W CONTRAST Result Date: 03/07/2024 CLINICAL DATA:  The lower abdominal pain with fevers. History of diverticulitis small diverticular abscess shown on CT 02/11/2024 EXAM: CT ABDOMEN AND PELVIS WITH CONTRAST TECHNIQUE: Multidetector CT imaging of the abdomen and pelvis was performed using the standard protocol following bolus administration of intravenous contrast. RADIATION DOSE REDUCTION: This exam was performed according to the departmental dose-optimization program which includes automated exposure control, adjustment of the mA and/or kV according to patient size and/or use of iterative reconstruction technique. CONTRAST:  OMNIPAQUE  IOHEXOL  300 MG/ML  SOLN COMPARISON:  02/11/2024 FINDINGS: Lower chest: Volume loss along the right hemidiaphragm compatible with atelectasis or  scarring. The degree of atelectasis is reduced compared 02/11/2024. Mild scarring or atelectasis posteriorly in the left lower lobe. Hepatobiliary: Cholecystectomy. Pancreas: Unremarkable Spleen: Unremarkable Adrenals/Urinary Tract: Left mid to lower kidney benign cyst unchanged. Otherwise unremarkable. Stomach/Bowel: Dominant partially intramural abscess of the upper rectum 5.9 by 3.9 by 3.7 cm (volume = 45 cm^3) on image 74 series 2, formerly along the serosal margin and formerly 5.6 by 2.1 by 3.3 cm (volume = 20 cm^3) on 02/11/2024. This likely connects to a more proximal abscess measuring 2.1 by 1.2 by 3.4 cm (volume = 4.5 cm^3) along the rectosigmoid junction on image 67 series 2. This previously measured 2.0 by 1.5 by 1.2 cm (volume = 1.9 cm^3). Vascular/Lymphatic: Atherosclerosis is present, including aortoiliac atherosclerotic disease. Reproductive: Unremarkable Other: No supplemental non-categorized findings. Musculoskeletal: Schmorl's nodes along the superior endplates at L1 and L2. IMPRESSION: 1. Likely connecting abscesses in the perirectal regions/rectosigmoid junction have just over  doubled in volume compared to 02/11/2024. 2. Aortic Atherosclerosis (ICD10-I70.0). Electronically Signed   By: Ryan Salvage M.D.   On: 03/07/2024 16:53    Scheduled Meds:  allopurinol   100 mg Oral QHS   atorvastatin   20 mg Oral QHS   diltiazem   120 mg Oral QHS   traZODone   100 mg Oral QHS   Continuous Infusions:  ceFEPime  (MAXIPIME ) IV     lactated ringers  150 mL/hr (03/08/24 0239)   metronidazole  500 mg (03/08/24 0639)     LOS: 1 day  MDM: Patient is high risk for one or more organ failure.  They necessitate ongoing hospitalization for continued IV therapies and subsequent lab monitoring. Total time spent interpreting labs and vitals, reviewing the medical record, coordinating care amongst consultants and care team members, directly assessing and discussing care with the patient and/or family: 55  min Laree Lock, MD [facility]  To contact the attending physician between 7A-7P please use Epic Chat. To contact the covering physician during after hours 7P-7A, please review Amion.  03/08/2024, 8:47 AM   *This document has been created with the assistance of dictation software. Please excuse typographical errors. *

## 2024-03-08 NOTE — Progress Notes (Signed)
 [facility]- General Surgery  SURGICAL PROGRESS NOTE  Hospital Day(s): 1.   Interval History:  Patient is overall feeling well this morning.  No longer having night sweats or chills.  Still having suprapubic abdominal pain. Had a small bowel movement this morning, had not had a bowel movement for about week. IR planning to place percutaneous drain today for abscesses seen on CT imaging.   Vital signs in last 24 hours: [min-max] current  Temp:  [98.2 F (36.8 C)-100.4 F (38 C)] 98.2 F (36.8 C) (09/03 0717) Pulse Rate:  [71-92] 71 (09/03 0717) Resp:  [16-19] 18 (09/03 0717) BP: (101-118)/(69-80) 114/77 (09/03 0717) SpO2:  [93 %-98 %] 96 % (09/03 0717) Weight:  [73.5 kg] 73.5 kg (09/02 1301)     Height: 5' 7 (170.2 cm) Weight: 73.5 kg BMI (Calculated): 25.37   Intake/Output last 2 shifts:  09/02 0701 - 09/03 0700 In: 1957.2 [I.V.:1600.6; IV Piggyback:356.6] Out: -    Physical Exam:  Constitutional: alert, cooperative and no distress  Respiratory: breathing non-labored at rest  Cardiovascular: regular rate and sinus rhythm  Gastrointestinal: soft, TTP on suprapubic area, and non-distended  Labs:     Latest Ref Rng & Units 03/08/2024    3:52 AM 03/07/2024    1:03 PM 02/13/2024    5:02 AM  CBC  WBC 4.0 - 10.5 K/uL 9.6  10.1  7.3   Hemoglobin 13.0 - 17.0 g/dL 87.1  85.3  87.2   Hematocrit 39.0 - 52.0 % 36.4  43.5  37.1   Platelets 150 - 400 K/uL 162  177  185       Latest Ref Rng & Units 03/08/2024    3:52 AM 03/07/2024    1:03 PM 02/13/2024    5:02 AM  CMP  Glucose 70 - 99 mg/dL 889  892  888   BUN 6 - 20 mg/dL 20  20  10    Creatinine 0.61 - 1.24 mg/dL 9.05  8.73  9.00   Sodium 135 - 145 mmol/L 135  134  138   Potassium 3.5 - 5.1 mmol/L 4.0  4.1  3.7   Chloride 98 - 111 mmol/L 101  98  99   CO2 22 - 32 mmol/L 24  26  28    Calcium  8.9 - 10.3 mg/dL 8.5  9.2  8.6   Total Protein 6.5 - 8.1 g/dL  7.4    Total Bilirubin 0.0 - 1.2 mg/dL  1.3    Alkaline Phos 38 - 126 U/L   154    AST 15 - 41 U/L  36    ALT 0 - 44 U/L  43      Imaging studies:  CLINICAL DATA:  The lower abdominal pain with fevers. History of diverticulitis small diverticular abscess shown on CT 02/11/2024   EXAM: CT ABDOMEN AND PELVIS WITH CONTRAST   TECHNIQUE: Multidetector CT imaging of the abdomen and pelvis was performed using the standard protocol following bolus administration of intravenous contrast.   RADIATION DOSE REDUCTION: This exam was performed according to the departmental dose-optimization program which includes automated exposure control, adjustment of the mA and/or kV according to patient size and/or use of iterative reconstruction technique.   CONTRAST:  OMNIPAQUE  IOHEXOL  300 MG/ML  SOLN   COMPARISON:  02/11/2024   FINDINGS: Lower chest: Volume loss along the right hemidiaphragm compatible with atelectasis or scarring. The degree of atelectasis is reduced compared 02/11/2024. Mild scarring or atelectasis posteriorly in the left lower lobe.  Hepatobiliary: Cholecystectomy.   Pancreas: Unremarkable   Spleen: Unremarkable   Adrenals/Urinary Tract: Left mid to lower kidney benign cyst unchanged. Otherwise unremarkable.   Stomach/Bowel: Dominant partially intramural abscess of the upper rectum 5.9 by 3.9 by 3.7 cm (volume = 45 cm^3) on image 74 series 2, formerly along the serosal margin and formerly 5.6 by 2.1 by 3.3 cm (volume = 20 cm^3) on 02/11/2024.   This likely connects to a more proximal abscess measuring 2.1 by 1.2 by 3.4 cm (volume = 4.5 cm^3) along the rectosigmoid junction on image 67 series 2. This previously measured 2.0 by 1.5 by 1.2 cm (volume = 1.9 cm^3).   Vascular/Lymphatic: Atherosclerosis is present, including aortoiliac atherosclerotic disease.   Reproductive: Unremarkable   Other: No supplemental non-categorized findings.   Musculoskeletal: Schmorl's nodes along the superior endplates at L1 and L2.   IMPRESSION: 1. Likely  connecting abscesses in the perirectal regions/rectosigmoid junction have just over doubled in volume compared to 02/11/2024. 2. Aortic Atherosclerosis (ICD10-I70.0).     Electronically Signed   By: Ryan Salvage M.D.   On: 03/07/2024 16:53   Assessment/Plan:  63 y.o. male with acute complicated diverticulitis with two abscesses since on CT imaging. One measuring 5.9 cm x 3.9 cm x 3.7 cm (volume = 45 cm^3) and another more proximal abscess measuring  2.1 cm x 1.2 cm x 3.4 cm (volume = 4.5 cm^3)   - Stable vital signs, no fever not tachycardic  - No leukocytosis  - IR attempting to place drain this morning  - If IR can successfully place drain, will give time for abscesses to heal and then consider sigmoidectomy with primary anastomosis. If drain cannot be placed, will discuss Hartmann surgery with patient and consult wound care for colostomy.  - Continue NPO for procedure today  - Continue IV cefepime  and metronidazole   - Continue IV fluids and pain management as needed  -- Waylen Depaolo Barrientos PA-C

## 2024-03-08 NOTE — Procedures (Signed)
 Interventional Radiology Procedure:   Indications: Pelvic abscess, presumably diverticular abscess  Procedure: CT guided pelvic drain placement  Findings: 10 Fr drain placed in pelvic abscess from left transgluteal approach.  Removed 10 ml of bloody purulent fluid.   Complications: None     EBL: Minimal  Plan: Follow drain output.  Sending fluid for culture.  Ronold Hardgrove R. Philip, MD  Pager: [phone]

## 2024-03-08 NOTE — Plan of Care (Signed)
  Problem: Fluid Volume: Goal: Hemodynamic stability will improve Outcome: Progressing   Problem: Clinical Measurements: Goal: Diagnostic test results will improve Outcome: Progressing Goal: Signs and symptoms of infection will decrease Outcome: Progressing   Problem: Respiratory: Goal: Ability to maintain adequate ventilation will improve Outcome: Progressing   Problem: Education: Goal: Knowledge of General Education information will improve Description: Including pain rating scale, medication(s)/side effects and non-pharmacologic comfort measures Outcome: Progressing   Problem: Health Behavior/Discharge Planning: Goal: Ability to manage health-related needs will improve Outcome: Progressing   Problem: Clinical Measurements: Goal: Ability to maintain clinical measurements within normal limits will improve Outcome: Progressing Goal: Will remain free from infection Outcome: Progressing Goal: Diagnostic test results will improve Outcome: Progressing Goal: Respiratory complications will improve Outcome: Progressing Goal: Cardiovascular complication will be avoided Outcome: Progressing   Problem: Coping: Goal: Level of anxiety will decrease Outcome: Progressing   Problem: Elimination: Goal: Will not experience complications related to bowel motility Outcome: Progressing Goal: Will not experience complications related to urinary retention Outcome: Progressing   Problem: Pain Managment: Goal: General experience of comfort will improve and/or be controlled Outcome: Progressing   Problem: Safety: Goal: Ability to remain free from injury will improve Outcome: Progressing   Problem: Skin Integrity: Goal: Risk for impaired skin integrity will decrease Outcome: Progressing

## 2024-03-09 DIAGNOSIS — K5792 Diverticulitis of intestine, part unspecified, without perforation or abscess without bleeding: Secondary | ICD-10-CM | POA: Diagnosis not present

## 2024-03-09 LAB — CBC
HCT: 35.7 % — ABNORMAL LOW (ref 39.0–52.0)
Hemoglobin: 12.2 g/dL — ABNORMAL LOW (ref 13.0–17.0)
MCH: 30.5 pg (ref 26.0–34.0)
MCHC: 34.2 g/dL (ref 30.0–36.0)
MCV: 89.3 fL (ref 80.0–100.0)
Platelets: 142 K/uL — ABNORMAL LOW (ref 150–400)
RBC: 4 MIL/uL — ABNORMAL LOW (ref 4.22–5.81)
RDW: 12.8 % (ref 11.5–15.5)
WBC: 6.4 K/uL (ref 4.0–10.5)
nRBC: 0 % (ref 0.0–0.2)

## 2024-03-09 LAB — BASIC METABOLIC PANEL WITH GFR
Anion gap: 9 (ref 5–15)
BUN: 18 mg/dL (ref 6–20)
CO2: 26 mmol/L (ref 22–32)
Calcium: 8.6 mg/dL — ABNORMAL LOW (ref 8.9–10.3)
Chloride: 99 mmol/L (ref 98–111)
Creatinine, Ser: 1.13 mg/dL (ref 0.61–1.24)
GFR, Estimated: >60 mL/min (ref >60)
Glucose, Bld: 104 mg/dL — ABNORMAL HIGH (ref 70–99)
Potassium: 4.3 mmol/L (ref 3.5–5.1)
Sodium: 134 mmol/L — ABNORMAL LOW (ref 135–145)

## 2024-03-09 NOTE — Progress Notes (Signed)
 PROGRESS NOTE    Erik Carpenter  FMW:[id number] DOB: [birth year] DOA: 03/07/2024 [facility]CP[facility]  Chief Complaint  Patient presents with   Abdominal Pain    Hospital Course:  Erik Carpenter is a 64 year old male with history of hyperlipidemia, insomnia, who presents emergency department for chief concerns of abdominal pain, fever.  Admitted for acute diverticulitis with abscess.  Seen by general surgery, recommend drain placement by IR. Hospital course as below  Subjective: Patient was examined at the bedside, new to me today.  Daughter present at the bedside. Drain placed by IR, with minimal serosanguineous output, pain significantly better   Objective: Vitals:   03/08/24 1620 03/08/24 2002 03/09/24 0356 03/09/24 0748  BP: 127/76 118/83 90/60 119/70  Pulse: 63 73 61 61  Resp: 19 16 16 17   Temp:  97.8 F (36.6 C) 97.6 F (36.4 C) 97.9 F (36.6 C)  TempSrc:      SpO2: 96% 96% 93% 96%  Weight:      Height:        Intake/Output Summary (Last 24 hours) at 03/09/2024 1640 Last data filed at 03/09/2024 1418 Gross per 24 hour  Intake 799.9 ml  Output 20 ml  Net 779.9 ml   Filed Weights   03/07/24 1301  Weight: 73.5 kg    Examination: Constitutional: appears age-appropriate, NAD, calm Neck: normal, supple, no masses, no thyromegaly Respiratory: clear to auscultation bilaterally, no wheezing, no crackles. Normal respiratory effort. No accessory muscle use.  Cardiovascular: Regular rate and rhythm, no murmurs / rubs / gallops. No extremity edema. 2+ pedal pulses. No carotid bruits.  Abdomen: TTP + suprapubic area, , no masses palpated, no hepatosplenomegaly. Bowel sounds positive, JP drain with serosanguineous output + Skin: no rashes, lesions, ulcers. No induration Neurologic: Sensation intact. Strength 5/5 in all 4.   Assessment & Plan:  Principal Problem:   Acute diverticulitis Active Problems:   Rectal abscess   Elevated serum creatinine   Elevated  alkaline phosphatase level   History of diverticulitis   Abscess of sigmoid colon   Essential hypertension   Hyperlipidemia   Insomnia   Gout  Acute diverticulitis with abscess S/p pelvic drain placement by IR - CT with likely connecting abscesses in the perirectal region/rectosigmoid junction that have just over doubled in volume compared to 02/11/2024 - General Surgery has been consulted and recommends admission for IV antibiotic and IR consultation.  Eventually will benefit from elective sigmoid colectomy with primary anastomosis - Blood culture 09/02 NGTD - Aspirate culture 09/03: Rare gram-negative rods, few GPC on Gram stain - S/p CT-guided pelvic drain placement by IR 09/03, 10 mL bloody purulent fluid aspirated - On Cefepime , Flagyl  - Pain management, tolerating regular diet - Follow blood cultures and aspirate cultures  Mild AKI - Resolved s/p IV fluids  Gout Home allopurinol  100 mg   Insomnia Home trazodone  100 mg   Hyperlipidemia Home atorvastatin  20 mg   Essential hypertension Home diltiazem  120 mg   DVT prophylaxis: SCD, will hold pharmacologic ppx until further plan   Code Status: Full Code Disposition:  Home  Consultants:  Treatment Team:  Consulting Physician: Tye Millet, DO General Surgery Interventional radiology  Procedures:  Pelvic drain placement by IR   Antimicrobials:  Anti-infectives (From admission, onward)    Start     Dose/Rate Route Frequency Ordered Stop   03/08/24 1400  ceFEPIme  (MAXIPIME ) 2 g in sodium chloride  0.9 % 100 mL IVPB        2 g 200  mL/hr over 30 Minutes Intravenous Every 8 hours 03/08/24 0839 03/14/24 1359   03/08/24 0600  metroNIDAZOLE  (FLAGYL ) IVPB 500 mg        500 mg 100 mL/hr over 60 Minutes Intravenous Every 12 hours 03/07/24 1745 03/15/24 0559   03/07/24 1815  ceFEPIme  (MAXIPIME ) 2 g in sodium chloride  0.9 % 100 mL IVPB  Status:  Discontinued        2 g 200 mL/hr over 30 Minutes Intravenous Every 12 hours  03/07/24 1759 03/08/24 0839   03/07/24 1730  metroNIDAZOLE  (FLAGYL ) IVPB 500 mg        500 mg 100 mL/hr over 60 Minutes Intravenous  Once 03/07/24 1722 03/07/24 1825   03/07/24 1730  ceFEPIme  (MAXIPIME ) 2 g in sodium chloride  0.9 % 100 mL IVPB  Status:  Discontinued        2 g 200 mL/hr over 30 Minutes Intravenous  Once 03/07/24 1725 03/07/24 1745       Data Reviewed: I have personally reviewed following labs and imaging studies CBC: Recent Labs  Lab 03/07/24 1303 03/08/24 0352 03/09/24 0413  WBC 10.1 9.6 6.4  HGB 14.6 12.8* 12.2*  HCT 43.5 36.4* 35.7*  MCV 90.6 87.7 89.3  PLT 177 162 142*   Basic Metabolic Panel: Recent Labs  Lab 03/07/24 1303 03/08/24 0352 03/09/24 0413  NA 134* 135 134*  K 4.1 4.0 4.3  CL 98 101 99  CO2 26 24 26   GLUCOSE 107* 110* 104*  BUN 20 20 18   CREATININE 1.26* 0.94 1.13  CALCIUM  9.2 8.5* 8.6*   GFR: Estimated Creatinine Clearance: 65 mL/min (by C-G formula based on SCr of 1.13 mg/dL). Liver Function Tests: Recent Labs  Lab 03/07/24 1303  AST 36  ALT 43  ALKPHOS 154*  BILITOT 1.3*  PROT 7.4  ALBUMIN 3.7   CBG: No results for input(s): GLUCAP in the last 168 hours.  Recent Results (from the past 240 hours)  Culture, blood (routine x 2)     Status: None (Preliminary result)   Collection Time: 03/07/24  5:42 PM   Specimen: BLOOD  Result Value Ref Range Status   Specimen Description BLOOD RIGHT ANTECUBITAL  Final   Special Requests   Final    BOTTLES DRAWN AEROBIC AND ANAEROBIC Blood Culture adequate volume   Culture   Final    NO GROWTH 2 DAYS Performed at [facility], [address], [place], [place] [address]    Report Status PENDING  Incomplete  Culture, blood (routine x 2)     Status: None (Preliminary result)   Collection Time: 03/07/24  5:47 PM   Specimen: BLOOD  Result Value Ref Range Status   Specimen Description BLOOD BLOOD RIGHT FOREARM  Final   Special Requests   Final    BOTTLES DRAWN AEROBIC AND  ANAEROBIC Blood Culture results may not be optimal due to an inadequate volume of blood received in culture bottles   Culture   Final    NO GROWTH 2 DAYS Performed at [facility]: [address], [address] [place], [place] [address]    Report Status PENDING  Incomplete  Aerobic/Anaerobic Culture w Gram Stain (surgical/deep wound)     Status: None (Preliminary result)   Collection Time: 03/08/24  3:38 PM   Specimen: Abscess  Result Value Ref Range Status   Specimen Description   Final    ABSCESS Performed at [facility], [address] [place], [place] [address]    Special Requests  Final    NONE Performed at [facility], [address] [place], [place] [address]    Gram Stain   Final    ABUNDANT WBC PRESENT, PREDOMINANTLY PMN RARE GRAM NEGATIVE RODS FEW GRAM POSITIVE COCCI IN PAIRS    Culture   Final    NO GROWTH < 12 HOURS Performed at [facility], [address], [place], [place] [address]    Report Status PENDING  Incomplete     Radiology Studies: CT GUIDED PERITONEAL/RETROPERITONEAL FLUID DRAIN BY PERC CATH Result Date: 03/08/2024 INDICATION: 63 year old with a pelvic abscess, presumably representing a diverticular abscess. EXAM: CT-GUIDED PELVIC ABSCESS DRAIN PLACEMENT MEDICATIONS: Moderate sedation ANESTHESIA/SEDATION: Moderate (conscious) sedation was employed during this procedure. A total of Versed  1mg  and fentanyl  50 mcg was administered intravenously at the order of the provider performing the procedure. Total intra-service moderate sedation time: 25 minutes. Patient's level of consciousness and vital signs were monitored continuously by radiology nurse throughout the procedure under the supervision of the provider performing the procedure. COMPLICATIONS: None immediate. PROCEDURE: Informed written consent was obtained from the patient after a thorough discussion of the procedural risks, benefits and alternatives. All questions were  addressed. Maximal Sterile Barrier Technique was utilized including caps, mask, sterile gowns, sterile gloves, sterile drape, hand hygiene and skin antiseptic. A timeout was performed prior to the initiation of the procedure. Patient was placed prone. CT images of the pelvis were obtained. Left gluteal region was prepped with chlorhexidine  and sterile field was created. Skin was anesthetized using 1% lidocaine. Small incision was made. Using CT guidance, an 18 gauge trocar needle was directed into the pelvic abscess using a left transgluteal approach. Superstiff Amplatz wire was placed and follow up CT images were obtained. The tract was dilated to accommodate a 10 [place] multipurpose drain. 10 mL of bloody purulent fluid was aspirated from the drain. Follow up CT images were obtained. Drain was sutured to skin and attached to a suction bulb. Dressing was placed. Fluid was sent for culture. RADIATION DOSE REDUCTION: This exam was performed according to the departmental dose-optimization program which includes automated exposure control, adjustment of the mA and/or kV according to patient size and/or use of iterative reconstruction technique. FINDINGS: Pelvic abscess appeared to be slightly smaller compared to the CT on 03/07/2024. Drain was successfully placed from a left transgluteal approach. 10 mL of purulent fluid was removed. Abscess was decompressed at the end of the procedure. IMPRESSION: CT-guided placement of a drain in the pelvic abscess. Electronically Signed   By: Juliene Balder M.D.   On: 03/08/2024 20:37    Scheduled Meds:  allopurinol   100 mg Oral QHS   atorvastatin   20 mg Oral QHS   diltiazem   120 mg Oral QHS   sodium chloride  flush  5 mL Intracatheter Q8H   traZODone   100 mg Oral QHS   Continuous Infusions:  ceFEPime  (MAXIPIME ) IV 2 g (03/09/24 1339)   metronidazole  500 mg (03/09/24 0538)     LOS: 2 days  MDM: Patient is high risk for one or more organ failure.  They necessitate ongoing  hospitalization for continued IV therapies and subsequent lab monitoring. Total time spent interpreting labs and vitals, reviewing the medical Carpenter, coordinating care amongst consultants and care team members, directly assessing and discussing care with the patient and/or family: 55 min Erik Lock, MD [facility]  To contact the attending physician between 7A-7P please use Epic Chat. To contact the covering physician during after hours 7P-7A,  please review Amion.  03/09/2024, 4:40 PM   *This document has been created with the assistance of dictation software. Please excuse typographical errors. *

## 2024-03-09 NOTE — Progress Notes (Addendum)
 [facility]- General Surgery  SURGICAL PROGRESS NOTE  Hospital Day(s): 2.   Interval History:  Patient states he is feeling much better. Suprapubic pain has improved, pain episodes are less frequent and less intense. Had drain placed yesterday by IR. Since placing the drain, he has had an output of 20 cc.  Was placed on full liquid diet after procedure, has been tolerating it well. Denies any nausea or vomiting.    Vital signs in last 24 hours: [min-max] current  Temp:  [97.6 F (36.4 C)-98.1 F (36.7 C)] 97.9 F (36.6 C) (09/04 0748) Pulse Rate:  [59-73] 61 (09/04 0748) Resp:  [12-19] 17 (09/04 0748) BP: (90-129)/(60-86) 119/70 (09/04 0748) SpO2:  [92 %-98 %] 96 % (09/04 0748)     Height: 5' 7 (170.2 cm) Weight: 73.5 kg BMI (Calculated): 25.37   Intake/Output last 2 shifts:  09/03 0701 - 09/04 0700 In: 319.9 [I.V.:25.1; IV Piggyback:294.8] Out: 20 [Drains:20]   Physical Exam:  Constitutional: alert, cooperative and no distress  Respiratory: breathing non-labored at rest  Cardiovascular: regular rate and sinus rhythm  Gastrointestinal: soft, tender on suprapubic area, and non-distended, minimal serosanguineous with some feculent output from drain located on left gluteal  Labs:     Latest Ref Rng & Units 03/09/2024    4:13 AM 03/08/2024    3:52 AM 03/07/2024    1:03 PM  CBC  WBC 4.0 - 10.5 K/uL 6.4  9.6  10.1   Hemoglobin 13.0 - 17.0 g/dL 87.7  87.1  85.3   Hematocrit 39.0 - 52.0 % 35.7  36.4  43.5   Platelets 150 - 400 K/uL 142  162  177       Latest Ref Rng & Units 03/09/2024    4:13 AM 03/08/2024    3:52 AM 03/07/2024    1:03 PM  CMP  Glucose 70 - 99 mg/dL 895  889  892   BUN 6 - 20 mg/dL 18  20  20    Creatinine 0.61 - 1.24 mg/dL 8.86  9.05  8.73   Sodium 135 - 145 mmol/L 134  135  134   Potassium 3.5 - 5.1 mmol/L 4.3  4.0  4.1   Chloride 98 - 111 mmol/L 99  101  98   CO2 22 - 32 mmol/L 26  24  26    Calcium  8.9 - 10.3 mg/dL 8.6  8.5  9.2   Total Protein 6.5 - 8.1  g/dL   7.4   Total Bilirubin 0.0 - 1.2 mg/dL   1.3   Alkaline Phos 38 - 126 U/L   154   AST 15 - 41 U/L   36   ALT 0 - 44 U/L   43     Imaging studies: No new pertinent imaging studies   Assessment/Plan:  63 y.o. male with acute complicated diverticulitis with two abscesses since on CT imaging. One measuring 5.9 cm x 3.9 cm x 3.7 cm (volume = 45 cm^3) and another more proximal abscess measuring  2.1 cm x 1.2 cm x 3.4 cm (volume = 4.5 cm^3)    - Stable vital signs, no fever not tachycardic  - JP drain with minimal serosanguineous output, will continue to monitor  - Advanced to regular diet  - If output continues to be minimal, patient is tolerating regular diet and pain is controlled, may be discharged tomorrow with drain. Will plan for sigmoidectomy with primary anastomosis in a few weeks once abscesses have resolved.  - Continue IV cefepime  and  Flagyl   - Continue pain management as needed  -- Danecia Underdown Barrientos PA-C

## 2024-03-09 NOTE — Plan of Care (Signed)

## 2024-03-10 ENCOUNTER — Other Ambulatory Visit: Payer: Self-pay

## 2024-03-10 DIAGNOSIS — K5792 Diverticulitis of intestine, part unspecified, without perforation or abscess without bleeding: Secondary | ICD-10-CM | POA: Diagnosis not present

## 2024-03-10 LAB — CBC
HCT: 38.7 % — ABNORMAL LOW (ref 39.0–52.0)
Hemoglobin: 13.1 g/dL (ref 13.0–17.0)
MCH: 30.3 pg (ref 26.0–34.0)
MCHC: 33.9 g/dL (ref 30.0–36.0)
MCV: 89.6 fL (ref 80.0–100.0)
Platelets: 171 K/uL (ref 150–400)
RBC: 4.32 MIL/uL (ref 4.22–5.81)
RDW: 12.7 % (ref 11.5–15.5)
WBC: 6.5 K/uL (ref 4.0–10.5)
nRBC: 0 % (ref 0.0–0.2)

## 2024-03-10 MED ORDER — ACETAMINOPHEN 325 MG PO TABS
650.0000 mg | ORAL_TABLET | Freq: Four times a day (QID) | ORAL | 0 refills | Status: DC | PRN
Start: 2024-03-10 — End: 2024-04-06

## 2024-03-10 MED ORDER — AMOXICILLIN-POT CLAVULANATE 875-125 MG PO TABS: 1.0000 | ORAL_TABLET | Freq: Two times a day (BID) | ORAL | 0 refills | Status: AC

## 2024-03-10 MED ORDER — SODIUM CHLORIDE FLUSH 0.9 % IV SOLN
10.0000 mL | INTRAVENOUS | 1 refills | Status: DC | PRN
  Filled 2024-03-10: qty 200, 10d supply, fill #0

## 2024-03-10 NOTE — Discharge Instructions (Signed)
 Interventional Radiology Percutaneous Abscess Drain Placement After Care   This sheet gives you information about how to care for yourself after your procedure. Your health care provider may also give you more specific instructions. Your drain was placed by an interventional radiologist with [facility]. If you have questions or concerns, contact [facility] at [phone].   What is a percutaneous drain?   A drain is a small plastic tube (catheter) that goes into the fluid collection in your body through your skin.   How long will I need the drain?   How long the drain needs to stay in is determined by where the drain is, how much comes out of the drain each day and if you are having any other surgical procedures.   Interventional radiology will determine when it is time to remove the drain. It is important to follow up as directed so that the drain can be removed as soon as it is safe to do so.   What can I expect after the procedure?   After the procedure, it is common to have:   A small amount of bruising and discomfort in the area where the drainage tube (catheter) was placed.   Sleepiness and fatigue. This should go away after the medicines you were given have worn off.   Follow these instructions at home:   Insertion site care   Check your insertion site when you change the bandage. Check for:   More redness, swelling, or pain.   More fluid or blood.   Warmth.   Pus or a bad smell.   When caring for your insertion site:   Wash your hands with soap and water for at least 20 seconds before and after you change your bandage (dressing). If soap and water are not available, use hand sanitizer.   You do not need to change your dressing everyday if it is clean and dry. Change your dressing every 3 days or as needed when it is soiled, wet or becoming dislodged. You will need to change your dressing each time you shower.   Leave stitches (sutures), skin  glue, or adhesive strips in place. These skin closures may need to stay in place for 2 weeks or longer. If adhesive strip edges start to loosen and curl up, you may trim the loose edges. Do not remove adhesive strips completely unless your health care provider tells you to do so.   Catheter care   Flush the catheter once per day with 5 mL of 0.9% normal saline unless you are told otherwise by your healthcare provider. This helps to prevent clogs in the catheter.   To disconnect the drain, turn the clear plastic tube to the left. Attach the saline syringe by placing it on the white end of the drain and turning gently to the right. Once attached gently push the plunger to the 5 mL mark. After you are done flushing, disconnect the syringe by turning to the left and reattach your drainage container   If you have a bulb please be sure the bulb is charged after reconnecting it - to do this pinch the bulb between your thumb and first finger and close the stopper located on the top of the bulb.    Check for fluid leaking from around your catheter (instead of fluid draining through your catheter). This may be a sign that the drain is no longer working correctly.   Write down the following information every time you empty your  bag:   The date and time.   The amount of drainage.   Activity   Rest at home for 1-2 days after your procedure.   For the first 48 hours do not lift anything more than 10 lbs (about a gallon of milk). You may perform moderate activities/exercise. Please avoid strenuous activities during this time.   Avoid any activities which may pull on your drain as this can cause your drain to become dislodged.   If you were given a sedative during the procedure, it can affect you for several hours. Do not drive or operate machinery until your health care provider says that it is safe.   General instructions   For mild pain take over-the-counter medications as needed for pain such as  Tylenol or Advil. If you are experiencing severe pain please call our office as this may indicate an issue with your drain.    If you were prescribed an antibiotic medicine, take it as told by your health care provider. Do not stop using the antibiotic even if you start to feel better.   You may shower 24 hours after the drain is placed. To do this cover the insertion site with a water tight material such as saran wrap and seal the edges with tape, you may also purchase waterproof dressings at your local drug store. Shower as usual and then remove the water tight dressing and any gauze/tape underneath it once you have exited the shower and dried off. Allow the area to air dry or pat dry with a clean towel. Once the skin is completely dry place a new gauze dressing. It is important to keep the site dry at all times to prevent infection.   Do not submerge the drain - this means you cannot take baths, swim, use a hot tub, etc. until the drain is removed.    Do not use any products that contain nicotine or tobacco, such as cigarettes, e-cigarettes, and chewing tobacco. If you need help quitting, ask your health care provider.   Keep all follow-up visits as told by your health care provider. This is important.   Contact a health care provider if:   You have less than 10 mL of drainage a day for 2-3 days in a row, or as directed by your health care provider.   You have any of these signs of infection:   More redness, swelling, or pain around your incision area.   More fluid or blood coming from your incision area.   Warmth coming from your incision area.   Pus or a bad smell coming from your incision area.   You have fluid leaking from around your catheter (instead of through your catheter).   You are unable to flush the drain.   You have a fever or chills.   You have pain that does not get better with medicine.   You have not been contacted to schedule a drain follow up appointment  within 10 days of discharge from the hospital.   Please call [facility] at [phone] with any questions or concerns.   Get help right away if:   Your catheter comes out.   You suddenly stop having drainage from your catheter.   You suddenly have blood in the fluid that is draining from your catheter.   You become dizzy or you faint.   You develop a rash.   You have nausea or vomiting.   You have difficulty breathing or you feel short  of breath.   You develop chest pain.   You have problems with your speech or vision.   You have trouble balancing or moving your arms or legs.   Summary   It is common to have a small amount of bruising and discomfort in the area where the drainage tube (catheter) was placed. You may also have minor discomfort with movement while the drain is in place.   Flush the drain once per day with 5 mL of 0.9% normal saline (unless you were told otherwise by your healthcare provider).    Record the amount of drainage from the bag every time you empty it.   Change the dressing every 3 days or earlier if soiled/wet. Keep the skin dry under the dressing.   You may shower with the drain in place. Do not submerge the drain (no baths, swimming, hot tubs, etc.).   Contact [facility] at [phone] if you have more redness, swelling, or pain around your incision area or if you have pain that does not get better with medicine.   This information is not intended to replace advice given to you by your health care provider. Make sure you discuss any questions you have with your health care provider.   Document Revised: 09/25/2021 Document Reviewed: 06/17/2019   Elsevier Patient Education  2023 [employer]         Interventional Radiology Drain Record   Empty your drain at least once per day. You may empty it as often as needed. Use this form to write down the amount of fluid that has collected in the drainage container. Bring  this form with you to your follow-up visits. Please call [facility] at [phone] with any questions or concerns prior to your appointment.   Drain #1 location: ___________________   Date __________ Time __________ Amount __________   Date __________ Time __________ Amount __________   Date __________ Time __________ Amount __________   Date __________ Time __________ Amount __________   Date __________ Time __________ Amount __________   Date __________ Time __________ Amount __________   Date __________ Time __________ Amount __________   Date __________ Time __________ Amount __________   Date __________ Time __________ Amount __________   Date __________ Time __________ Amount __________   Date __________ Time __________ Amount __________   Date __________ Time __________ Amount __________   Date __________ Time __________ Amount __________   Date __________ Time __________ Amount __________

## 2024-03-10 NOTE — Plan of Care (Signed)

## 2024-03-10 NOTE — Progress Notes (Signed)
 [facility]- General Surgery  SURGICAL PROGRESS NOTE  Hospital Day(s): 3.   Interval History:  Patient is doing great. States he has been tolerating regular diet with no problem. Reports having two bowel movements yesterday. Has been ambulating with minimal pain. Reports discomfort near drain placement. Otherwise, patient has no complaints.    Vital signs in last 24 hours: [min-max] current  Temp:  [97.8 F (36.6 C)-98.3 F (36.8 C)] 97.9 F (36.6 C) (09/05 0744) Pulse Rate:  [55-79] 55 (09/05 0744) Resp:  [16-18] 18 (09/05 0744) BP: (95-114)/(63-82) 114/82 (09/05 0744) SpO2:  [93 %-97 %] 97 % (09/05 0744)     Height: 5' 7 (170.2 cm) Weight: 73.5 kg BMI (Calculated): 25.37   Intake/Output last 2 shifts:  09/04 0701 - 09/05 0700 In: 480 [P.O.:480] Out: 0    Physical Exam:  Constitutional: alert, cooperative and no distress  Respiratory: breathing non-labored at rest  Cardiovascular: regular rate and sinus rhythm  Gastrointestinal: soft, mildly tender near suprapubic area, and non-distended, minimal serosanguinous output in drain   Labs:     Latest Ref Rng & Units 03/10/2024    4:49 AM 03/09/2024    4:13 AM 03/08/2024    3:52 AM  CBC  WBC 4.0 - 10.5 K/uL 6.5  6.4  9.6   Hemoglobin 13.0 - 17.0 g/dL 86.8  87.7  87.1   Hematocrit 39.0 - 52.0 % 38.7  35.7  36.4   Platelets 150 - 400 K/uL 171  142  162       Latest Ref Rng & Units 03/09/2024    4:13 AM 03/08/2024    3:52 AM 03/07/2024    1:03 PM  CMP  Glucose 70 - 99 mg/dL 895  889  892   BUN 6 - 20 mg/dL 18  20  20    Creatinine 0.61 - 1.24 mg/dL 8.86  9.05  8.73   Sodium 135 - 145 mmol/L 134  135  134   Potassium 3.5 - 5.1 mmol/L 4.3  4.0  4.1   Chloride 98 - 111 mmol/L 99  101  98   CO2 22 - 32 mmol/L 26  24  26    Calcium  8.9 - 10.3 mg/dL 8.6  8.5  9.2   Total Protein 6.5 - 8.1 g/dL   7.4   Total Bilirubin 0.0 - 1.2 mg/dL   1.3   Alkaline Phos 38 - 126 U/L   154   AST 15 - 41 U/L   36   ALT 0 - 44 U/L   43      Imaging studies: No new pertinent imaging studies   Assessment/Plan:  63 y.o. male with acute complicated diverticulitis with two abscesses since on CT imaging. One measuring 5.9 cm x 3.9 cm x 3.7 cm (volume = 45 cm^3) and another more proximal abscess measuring 2.1 cm x 1.2 cm x 3.4 cm (volume = 4.5 cm^3)    -Stable vital signs, no leukocytosis   -Patient is clear from surgical standpoint. He is tolerating regular diet and had a few bowel movements. Pain is also controlled. Will follow-up in 2 weeks outpatient to further discuss surgery. Culture report is not back yet-in the meantime will complete a 7-day course of Augmentin    - Appreciate IR recommendations in regards to the managing the drain home and outpatient follow-up  -- Brekyn Huntoon Barrientos PA-C

## 2024-03-10 NOTE — Progress Notes (Signed)
 Referring Provider(s):  Dr. Greig Free  Supervising Physician: Jenna Hacker  Patient Status:  [facility] - In-pt  Chief Complaint: Diverticulitis w/ abscess formation s/p drain placement on 9/3  Brief History:  62 year old male with a history of diverticulitis for which he was previously hospitalized in early August. At that time he was noted to have a small 2x2x1cm diverticular abscess and IR was consulted for possible drainage; unfortunately given the small size the abscess was not amenable to drainage and the patient was managed conservatively with IV antibiotics and discharge on 8/11. He followed up outpatient with Dr. Tye  on 8/18 at which time he was reportedly doing well. Unfortunately he returned to the ED 9/2 with complaints of fever, chills, lower abdominal pain, and decreased stool output for the previous week. ED workup revealed uptrending temperature to 100.4, no leukocytosis, and imaging concerning for abscess along the rectosigmoid junction an upper rectum. IR was again consulted and patient underwent left transgluteal drain placement on 9/3 with Dr. DELENA Balder.   Subjective:  Patient reports doing well. States he has had a few bowel movements this morning. Denies any pain at drain site, changes in output, fevers/chills.   Allergies: Isosorbide nitrate, Oxycodone, and Oxycodone hcl  Medications: Prior to Admission medications   Medication Sig Start Date End Date Taking? Authorizing Provider  allopurinol  (ZYLOPRIM ) 100 MG tablet Take 200 mg by mouth daily. 12/15/23  Yes [provider]  amoxicillin -clavulanate (AUGMENTIN ) 875-125 MG tablet Take 1 tablet by mouth 2 (two) times daily for 7 days. 03/10/24 03/17/24 Yes Barrientos Solis, Adyazbeth, PA-C  aspirin  EC 81 MG tablet Take 81 mg by mouth once. 12/15/23  Yes [provider]  atorvastatin  (LIPITOR) 20 MG tablet Take 20 mg by mouth.  TAKE ONE TABLET BY MOUTH AT BEDTIME FOR CHOLESTEROL 12/15/23  Yes [provider]  diltiazem  (DILACOR XR ) 120 MG 24 hr capsule Take 120 mg by mouth.  TAKE ONE CAPSULE BY MOUTH EVERY DAY THIS REPLACES METOPROLOL 12/15/23  Yes [provider]  indomethacin (INDOCIN) 25 MG capsule Take 25 mg by mouth 2 (two) times daily with a meal.   Yes [provider]  sodium chloride  flush 0.9 % SOLN injection 10 mLs by Intracatheter route as needed. 03/10/24  Yes Gumaro Brightbill M, PA-C  terbinafine  (LAMISIL ) 250 MG tablet Take 250 mg by mouth.  TAKE ONE TABLET BY MOUTH EVERY DAY 12/15/23  Yes [provider]  traZODone  (DESYREL ) 100 MG tablet Take 100 mg by mouth.  TAKE ONE TABLET BY MOUTH AT BEDTIME FOR SLEEP 11/22/23  Yes [provider]  amoxicillin  (AMOXIL ) 500 MG capsule Take 500 mg by mouth 3 (three) times daily. Patient not taking: Reported on 03/07/2024 03/31/23   [provider]    Vital Signs: BP 114/82 (BP Location: Left Arm)   Pulse (!) 55   Temp 97.9 F (36.6 C)   Resp 18   Ht 5' 7 (1.702 m)   Wt 162 lb (73.5 kg)   SpO2 97%   BMI 25.37 kg/m    Physical Exam Vitals reviewed.  Constitutional:      Appearance: Normal appearance.  Cardiovascular:     Rate and Rhythm: Normal rate.  Pulmonary:     Effort: Pulmonary effort is normal.  Abdominal:     General: Abdomen is flat.     Palpations: Abdomen is soft.     Tenderness: There is no abdominal tenderness.     Comments: Left transgluteal drain  sutured in place w/ overlying bandage. Bulb to suction w/ ~5cc serosanguinous fluid. Insertion site is non-erythematous and non-tender to palpation. Flushes/aspirates easily   Musculoskeletal:        General: Normal range of motion.  Skin:    General: Skin is warm and dry.  Neurological:     Mental Status: He is alert and oriented to person, place, and time.  Psychiatric:        Behavior: Behavior normal.     Drain Location: Left Transgluteal  Size: Fr size: 10 Fr Date of placement: 03/08/24  Currently to: Drain  collection device: suction bulb 24 hour output:  Output by Drain (mL) 03/08/24 0701 - 03/08/24 1900 03/08/24 1901 - 03/09/24 0700 03/09/24 0701 - 03/09/24 1900 03/09/24 1901 - 03/10/24 0700 03/10/24 0701 - 03/10/24 1136  Closed System Drain 1 Lateral;Left Back  20  0     Interval imaging/drain manipulation:  None  Current examination: Flushes/aspirates easily.  Insertion site unremarkable. Suture and stat lock in place. Dressed appropriately.   Labs:  CBC: Recent Labs    03/07/24 1303 03/08/24 0352 03/09/24 0413 03/10/24 0449  WBC 10.1 9.6 6.4 6.5  HGB 14.6 12.8* 12.2* 13.1  HCT 43.5 36.4* 35.7* 38.7*  PLT 177 162 142* 171    COAGS: No results for input(s): INR, APTT in the last 8760 hours.  BMP: Recent Labs    02/13/24 0502 03/07/24 1303 03/08/24 0352 03/09/24 0413  NA 138 134* 135 134*  K 3.7 4.1 4.0 4.3  CL 99 98 101 99  CO2 28 26 24 26   GLUCOSE 111* 107* 110* 104*  BUN 10 20 20 18   CALCIUM  8.6* 9.2 8.5* 8.6*  CREATININE 0.99 1.26* 0.94 1.13  GFRNONAA >60 >60 >60 >60    LIVER FUNCTION TESTS: Recent Labs    02/08/24 0925 03/07/24 1303  BILITOT 1.9* 1.3*  AST 40 36  ALT 38 43  ALKPHOS 81 154*  PROT 6.9 7.4  ALBUMIN 4.0 3.7    Assessment and Plan:  Diverticular abscess: Erik Carpenter is a 63 y.o. male with a history of diverticulitis managed conservatively in early August who returned with increased size of rectosigmoid abscess. Patient underwent drain placement in IR on 9/3 with Dr. Philip.   -Patient doing well and looking forward to discharge -Drain flushes/aspirates easily  -Discussed at home drain care with both patient and daughter who state they are comfortable managing at home  -Provided with drain care supplies -Additional saline flushes can be picked up at [facility] if needed -Discussed 10-14 day follow up with our team in clinic for repeat imaging and injection to evaluate for fistula and possible drain removal   -Patient informed to flush drain daily, record output every day, and change dressing every 2-3 days or earlier if soiled -Discharge instructions provided in AVS  Please reach out with any additional questions or concerns.  Thank you for allowing our service to participate in Erik Carpenter 's care.   Electronically Signed: Jahnae Mcadoo M Indiah Heyden, PA-C 03/10/2024, 10:30 AM    I spent a total of 25 Minutes at the the patient's bedside AND on the patient's hospital floor or unit, greater than 50% of which was counseling/coordinating care for drain care follow up.

## 2024-03-10 NOTE — TOC CM/SW Note (Signed)
 Transition of Care [facility]) - Inpatient Brief Assessment   Patient Details  Name: Latham Kinzler MRN: [id number] Date of Birth: 07-01-1961  Transition of Care [facility]) CM/SW Contact:    Corean ONEIDA Haddock, RN Phone Number: 03/10/2024, 12:34 PM   Clinical Narrative:    Transition of Care [facility]) Screening Note   Patient Details  Name: Nirav Sweda Date of Birth: 11/27/1960   Transition of Care [facility]) CM/SW Contact:    Corean ONEIDA Haddock, RN Phone Number: 03/10/2024, 12:34 PM    Transition of Care Department [facility]) has reviewed patient and no TOC needs have been identified at this time.  If new patient transition needs arise, please place a TOC consult.   Transition of Care Asessment: Insurance and Status: Insurance coverage has been reviewed Patient has primary care physician: Yes     Prior/Current Home Services: No current home services Social Drivers of Health Review: SDOH reviewed no interventions necessary Readmission risk has been reviewed: Yes Transition of care needs: no transition of care needs at this time

## 2024-03-10 NOTE — Discharge Summary (Addendum)
 Physician Discharge Summary   Patient: Erik Carpenter MRN: [id number] DOB: [birth year]  Admit date:     03/07/2024  Discharge date: 03/10/24  Discharge Physician: Laree Lock   PCP: [facility]   Recommendations at discharge:   Follow-up with PCP within 2 weeks -repeat BMP, CBC  Follow-up with surgery within 2 weeks Follow-up with interventional radiology in 10 to 14 days for drain management  Discharge Diagnoses: Principal Problem:   Acute diverticulitis Active Problems:   Rectal abscess   Elevated serum creatinine   Elevated alkaline phosphatase level   History of diverticulitis   Abscess of sigmoid colon   Essential hypertension   Hyperlipidemia   Insomnia   Gout  Resolved Problems:   * No resolved hospital problems. [facility] Course: [name] is a 63 year old male with history of hyperlipidemia, insomnia, who presents emergency department for chief concerns of abdominal pain, fever.  Admitted for acute diverticulitis with abscess.  Seen by general surgery, pelvic drain placed by IR. Hospital course as below   Acute diverticulitis with abscess S/p pelvic drain placement by IR - CT with likely connecting abscesses in the perirectal region/rectosigmoid junction that have just over doubled in volume compared to 02/11/2024 - General Surgery has been consulted and recommended IR consultation for drain placement.   - S/p CT-guided pelvic drain placement by IR 09/03, 10 mL bloody purulent fluid aspirated - Blood culture 09/02 NGTD - Aspirate culture 09/03: Rare gram-negative rods, few GPC on Gram stain - was on Cefepime , Flagyl , started on p.o. Augmentin  to complete 10 days - Pain management with tylenol , tolerating regular diet - Follow-up with surgery outpatient in 2 weeks to discuss interval colectomy - Drain management at home and change dressing every 2 to 3 days, follow-up with IR in 10 to 14 days  Addendum : Discussed with patient culture  result with E. coli, Streptococcus anginosus, Bacteroides, will discharge on Augmentin  to complete 10 days, added Bactrim  1 DS bid for 7 days to complete course of 10 days.  Advised to follow-up with PCP in 1 week for repeat BMP Discussed antibiotic recs with ID pharm  Mild AKI - Resolved s/p IV fluids   Consultants: General surgery, interventional radiology Procedures performed: Pelvic drain placement Disposition: Home Diet recommendation:  Discharge Diet Orders (From admission, onward)     Start     Ordered   03/10/24 0000  Diet - low sodium heart healthy        03/10/24 1547            DISCHARGE MEDICATION: Allergies as of 03/10/2024       Reactions   Isosorbide Nitrate Other (See Comments)   Oxycodone Itching   Oxycodone Hcl Other (See Comments)        Medication List     STOP taking these medications    amoxicillin  500 MG capsule Commonly known as: AMOXIL        TAKE these medications    acetaminophen  325 MG tablet Commonly known as: TYLENOL  Take 2 tablets (650 mg total) by mouth every 6 (six) hours as needed for mild pain (pain score 1-3) or fever (or Fever >/= 101).   allopurinol  100 MG tablet Commonly known as: ZYLOPRIM  Take 200 mg by mouth daily.   amoxicillin -clavulanate 875-125 MG tablet Commonly known as: AUGMENTIN  Take 1 tablet by mouth 2 (two) times daily for 7 days.   aspirin  EC 81 MG tablet Take 81 mg by mouth once.   atorvastatin  20 MG tablet  Commonly known as: LIPITOR Take 20 mg by mouth.  TAKE ONE TABLET BY MOUTH AT BEDTIME FOR CHOLESTEROL   diltiazem  120 MG 24 hr capsule Commonly known as: DILACOR XR  Take 120 mg by mouth.  TAKE ONE CAPSULE BY MOUTH EVERY DAY THIS REPLACES METOPROLOL   indomethacin 25 MG capsule Commonly known as: INDOCIN Take 25 mg by mouth 2 (two) times daily with a meal.   sodium chloride  flush 0.9 % Soln injection Use 10 mLs by Intracatheter route as needed.   terbinafine  250 MG tablet Commonly known as:  LAMISIL  Take 250 mg by mouth.  TAKE ONE TABLET BY MOUTH EVERY DAY   traZODone  100 MG tablet Commonly known as: DESYREL  Take 100 mg by mouth.  TAKE ONE TABLET BY MOUTH AT BEDTIME FOR SLEEP               Discharge Care Instructions  (From admission, onward)           Start     Ordered   03/10/24 0000  Discharge wound care:       Comments: Drain management - flush drain daily, record output every day, and change dressing every 2-3 days or earlier if soiled   03/10/24 1547            Follow-up Information     Carpenter, Isami, DO Follow up in 2 week(s).   Specialties: General Surgery, Surgery Why: acute complicated diverticulitis with drain, discuss surgery Contact information: [address] [place] [address] [phone]                Discharge Exam: Erik Carpenter   03/07/24 1301  Weight: 73.5 kg   Constitutional: appears age-appropriate, NAD, calm Neck: normal, supple, no masses, no thyromegaly Respiratory: clear to auscultation bilaterally, no wheezing, no crackles Cardiovascular: Regular rate and rhythm, no murmurs / rubs / gallops. No extremity edema Abdomen: mild TTP + suprapubic area, , no masses palpated, no hepatosplenomegaly. Bowel sounds positive, JP drain with serosanguineous output + Skin: no rashes, lesions, ulcers. No induration Neurologic: Sensation intact. Strength 5/5 in all 4  Condition at discharge: good  The results of significant diagnostics from this hospitalization (including imaging, microbiology, ancillary and laboratory) are listed below for reference.   Imaging Studies: CT GUIDED PERITONEAL/RETROPERITONEAL FLUID DRAIN BY PERC CATH Result Date: 03/08/2024 INDICATION: 64 year old with a pelvic abscess, presumably representing a diverticular abscess. EXAM: CT-GUIDED PELVIC ABSCESS DRAIN PLACEMENT MEDICATIONS: Moderate sedation ANESTHESIA/SEDATION: Moderate (conscious) sedation was employed during this procedure. A total of  Versed  1mg  and fentanyl  50 mcg was administered intravenously at the order of the provider performing the procedure. Total intra-service moderate sedation time: 25 minutes. Patient's level of consciousness and vital signs were monitored continuously by radiology nurse throughout the procedure under the supervision of the provider performing the procedure. COMPLICATIONS: None immediate. PROCEDURE: Informed written consent was obtained from the patient after a thorough discussion of the procedural risks, benefits and alternatives. All questions were addressed. Maximal Sterile Barrier Technique was utilized including caps, mask, sterile gowns, sterile gloves, sterile drape, hand hygiene and skin antiseptic. A timeout was performed prior to the initiation of the procedure. Patient was placed prone. CT images of the pelvis were obtained. Left gluteal region was prepped with chlorhexidine  and sterile field was created. Skin was anesthetized using 1% lidocaine. Small incision was made. Using CT guidance, an 18 gauge trocar needle was directed into the pelvic abscess using a left transgluteal approach. Superstiff Amplatz wire was placed and follow  up CT images were obtained. The tract was dilated to accommodate a 10 [place] multipurpose drain. 10 mL of bloody purulent fluid was aspirated from the drain. Follow up CT images were obtained. Drain was sutured to skin and attached to a suction bulb. Dressing was placed. Fluid was sent for culture. RADIATION DOSE REDUCTION: This exam was performed according to the departmental dose-optimization program which includes automated exposure control, adjustment of the mA and/or kV according to patient size and/or use of iterative reconstruction technique. FINDINGS: Pelvic abscess appeared to be slightly smaller compared to the CT on 03/07/2024. Drain was successfully placed from a left transgluteal approach. 10 mL of purulent fluid was removed. Abscess was decompressed at the end of the  procedure. IMPRESSION: CT-guided placement of a drain in the pelvic abscess. Electronically Signed   By: Juliene Balder M.D.   On: 03/08/2024 20:37   CT ABDOMEN PELVIS W CONTRAST Result Date: 03/07/2024 CLINICAL DATA:  The lower abdominal pain with fevers. History of diverticulitis small diverticular abscess shown on CT 02/11/2024 EXAM: CT ABDOMEN AND PELVIS WITH CONTRAST TECHNIQUE: Multidetector CT imaging of the abdomen and pelvis was performed using the standard protocol following bolus administration of intravenous contrast. RADIATION DOSE REDUCTION: This exam was performed according to the departmental dose-optimization program which includes automated exposure control, adjustment of the mA and/or kV according to patient size and/or use of iterative reconstruction technique. CONTRAST:  OMNIPAQUE  IOHEXOL  300 MG/ML  SOLN COMPARISON:  02/11/2024 FINDINGS: Lower chest: Volume loss along the right hemidiaphragm compatible with atelectasis or scarring. The degree of atelectasis is reduced compared 02/11/2024. Mild scarring or atelectasis posteriorly in the left lower lobe. Hepatobiliary: Cholecystectomy. Pancreas: Unremarkable Spleen: Unremarkable Adrenals/Urinary Tract: Left mid to lower kidney benign cyst unchanged. Otherwise unremarkable. Stomach/Bowel: Dominant partially intramural abscess of the upper rectum 5.9 by 3.9 by 3.7 cm (volume = 45 cm^3) on image 74 series 2, formerly along the serosal margin and formerly 5.6 by 2.1 by 3.3 cm (volume = 20 cm^3) on 02/11/2024. This likely connects to a more proximal abscess measuring 2.1 by 1.2 by 3.4 cm (volume = 4.5 cm^3) along the rectosigmoid junction on image 67 series 2. This previously measured 2.0 by 1.5 by 1.2 cm (volume = 1.9 cm^3). Vascular/Lymphatic: Atherosclerosis is present, including aortoiliac atherosclerotic disease. Reproductive: Unremarkable Other: No supplemental non-categorized findings. Musculoskeletal: Schmorl's nodes along the superior endplates at  L1 and L2. IMPRESSION: 1. Likely connecting abscesses in the perirectal regions/rectosigmoid junction have just over doubled in volume compared to 02/11/2024. 2. Aortic Atherosclerosis (ICD10-I70.0). Electronically Signed   By: Ryan Salvage M.D.   On: 03/07/2024 16:53   CT ABDOMEN PELVIS W CONTRAST Result Date: 02/11/2024 EXAM: CT ABDOMEN AND PELVIS WITH CONTRAST 02/11/2024 09:14:07 AM TECHNIQUE: CT of the abdomen and pelvis was performed with the administration of intravenous contrast. Multiplanar reformatted images are provided for review. Automated exposure control, iterative reconstruction, and/or weight based adjustment of the mA/kV was utilized to reduce the radiation dose to as low as reasonably achievable. COMPARISON: CT of the abdomen and pelvis dated 02/08/2024. CLINICAL HISTORY: Diverticulitis, complication suspected. Re-eval known divert. FINDINGS: LOWER CHEST: There has been interval worsening of streaky vascular opacification/consolidation within the lower lobes bilaterally and there is worsening bilateral dependent atelectasis. There are new small bilateral pleural effusions, slightly larger on the right. LIVER: The patient is status post cholecystectomy. GALLBLADDER AND BILE DUCTS: Gallbladder is surgically absent. No biliary ductal dilatation. SPLEEN: No acute abnormality. PANCREAS: No acute abnormality. ADRENAL GLANDS:  No acute abnormality. KIDNEYS, URETERS AND BLADDER: There is a small simple cyst within the lower pole of the left kidney, which does not require follow up. The kidneys are otherwise normal. No stones in the kidneys or ureters. No hydronephrosis. No perinephric or periureteral stranding. A Foley catheter is present. GI AND BOWEL: There is diffuse abnormal thickening of the wall of the sigmoid colon, which is contracted. There is adjacent soft tissue stranding. There is an inflamed diverticulum with a small diverticular abscess, measuring approximately 2 x 2 x 1 cm. PERITONEUM  AND RETROPERITONEUM: No ascites. No free air. VASCULATURE: Aorta is normal in caliber. LYMPH NODES: No lymphadenopathy. REPRODUCTIVE ORGANS: No acute abnormality. BONES AND SOFT TISSUES: No acute osseous abnormality. No focal soft tissue abnormality. IMPRESSION: 1. Worsening bilateral lower lobe consolidation and dependent atelectasis compared to prior study. New small bilateral pleural effusions, slightly larger on the right. 2. Status post cholecystectomy. 3. Diverticulitis in the sigmoid colon with a small diverticular abscess measuring approximately 2 x 2 x 1 cm. Electronically signed by: evalene coho 02/11/2024 09:55 AM EDT RP Workstation: [id number]    Microbiology: Results for orders placed or performed during the hospital encounter of 03/07/24  Culture, blood (routine x 2)     Status: None (Preliminary result)   Collection Time: 03/07/24  5:42 PM   Specimen: BLOOD  Result Value Ref Range Status   Specimen Description BLOOD RIGHT ANTECUBITAL  Final   Special Requests   Final    BOTTLES DRAWN AEROBIC AND ANAEROBIC Blood Culture adequate volume   Culture   Final    NO GROWTH 3 DAYS Performed at [facility], [address] [place], [place] [address]    Report Status PENDING  Incomplete  Culture, blood (routine x 2)     Status: None (Preliminary result)   Collection Time: 03/07/24  5:47 PM   Specimen: BLOOD  Result Value Ref Range Status   Specimen Description BLOOD BLOOD RIGHT FOREARM  Final   Special Requests   Final    BOTTLES DRAWN AEROBIC AND ANAEROBIC Blood Culture results may not be optimal due to an inadequate volume of blood received in culture bottles   Culture   Final    NO GROWTH 3 DAYS Performed at [facility], [address] [place], [place] [address]    Report Status PENDING  Incomplete  Aerobic/Anaerobic Culture w Gram Stain (surgical/deep wound)     Status: None (Preliminary result)   Collection Time: 03/08/24  3:38 PM   Specimen: Abscess   Result Value Ref Range Status   Specimen Description   Final    ABSCESS Performed at [facility], [address], [place], [place] [address]    Special Requests   Final    NONE Performed at [facility], [address] [place], [place] [address]    Gram Stain   Final    ABUNDANT WBC PRESENT, PREDOMINANTLY PMN RARE GRAM NEGATIVE RODS FEW GRAM POSITIVE COCCI IN PAIRS    Culture   Final    CULTURE REINCUBATED FOR BETTER GROWTH Performed at [facility], [address], [place], [place] [address]    Report Status PENDING  Incomplete    Labs: CBC: Recent Labs  Lab 03/07/24 1303 03/08/24 0352 03/09/24 0413 03/10/24 0449  WBC 10.1 9.6 6.4 6.5  HGB 14.6 12.8* 12.2* 13.1  HCT 43.5 36.4* 35.7* 38.7*  MCV 90.6 87.7 89.3 89.6  PLT 177 162 142* 171   Basic  Metabolic Panel: Recent Labs  Lab 03/07/24 1303 03/08/24 0352 03/09/24 0413  NA 134* 135 134*  K 4.1 4.0 4.3  CL 98 101 99  CO2 26 24 26   GLUCOSE 107* 110* 104*  BUN 20 20 18   CREATININE 1.26* 0.94 1.13  CALCIUM  9.2 8.5* 8.6*   Liver Function Tests: Recent Labs  Lab 03/07/24 1303  AST 36  ALT 43  ALKPHOS 154*  BILITOT 1.3*  PROT 7.4  ALBUMIN 3.7   CBG: No results for input(s): GLUCAP in the last 168 hours.  Discharge time spent: greater than 30 minutes.  Signed: Laree Lock, MD [facility] 03/10/2024

## 2024-03-12 LAB — AEROBIC/ANAEROBIC CULTURE W GRAM STAIN (SURGICAL/DEEP WOUND)

## 2024-03-12 LAB — CULTURE, BLOOD (ROUTINE X 2)
Culture: NO GROWTH
Culture: NO GROWTH
Special Requests: ADEQUATE

## 2024-03-13 ENCOUNTER — Other Ambulatory Visit: Payer: Self-pay | Admitting: Surgery

## 2024-03-13 DIAGNOSIS — K630 Abscess of intestine: Secondary | ICD-10-CM

## 2024-03-14 ENCOUNTER — Other Ambulatory Visit: Payer: Self-pay

## 2024-03-14 MED ORDER — SULFAMETHOXAZOLE-TRIMETHOPRIM 800-160 MG PO TABS: 1.0000 | ORAL_TABLET | Freq: Two times a day (BID) | ORAL | 0 refills | Status: AC

## 2024-03-22 ENCOUNTER — Ambulatory Visit: Payer: Self-pay | Admitting: Surgery

## 2024-03-22 NOTE — Progress Notes (Signed)
 Subjective:   CC: Diverticulitis of large intestine with abscess without bleeding [K57.20]  HPI: returns for evaluation of above.  History of Present Illness Erik Carpenter is a 63 year old male with recurrent diverticulitis who presents for follow-up regarding surgical management.  He recently had an episode of diverticulitis requiring abscess drainage. The drain has not produced output for four days. A follow-up with interventional radiology is scheduled to assess the drain for potential removal. He has had two episodes of diverticulitis within a month, leading to discussions about surgical intervention to prevent further recurrence. His stools have generally been soft even before these episodes. He is concerned about the cost of the procedure as his wife is the sole income earner and he relies on her insurance.   Past Medical History:  has no past medical history on file.  Past Surgical History:  has no past surgical history on file.  Family History: family history is not on file.  Social History:  reports that he has never smoked. He has never used smokeless tobacco. No history on file for alcohol use and drug use.  Current Medications: has a current medication list which includes the following prescription(s): allopurinol , amoxicillin , aspirin , atorvastatin , diltiazem , indomethacin, and trazodone .  Allergies:  Allergies  Allergen Reactions  . Isosorbide Mononitrate Headache  . Oxycodone Anxiety and Itching    ROS:  A 15 point review of systems was performed and pertinent positives and negatives noted in HPI   Objective:     There were no vitals taken for this visit.  Constitutional :  No distress, cooperative, alert  Lymphatics/Throat:  Supple with no lymphadenopathy  Respiratory:  Clear to auscultation bilaterally  Cardiovascular:  Regular rate and rhythm  Gastrointestinal: Soft, non-tender, non-distended, no organomegaly.  Musculoskeletal: Steady gait and movement   Skin: Cool and moist  Psychiatric: Normal affect, non-agitated, not confused       LABS:  N/a    RADS: N/a  Assessment:      Diverticulitis of large intestine with abscess without bleeding [K57.20]  Plan:     Discussed pathophisiology in depth.  The risk of laparoscopic colon resection surgery includes, but not limited to, recurrence, bleeding, chronic pain, post-op infxn, post-op SBO or ileus, hernias, resection of bowel, re-anastamosis, possible ostomy placement and need for re-operation to address said risks. The risks of general anesthetic, if used, includes MI, CVA, sudden death or even reaction to anesthetic medications also discussed. Alternatives include continued observation.  Benefits include possible symptom relief, preventing further decline in health and possible death.   Typical post-op recovery time of additional days in hospital for observation afterwards also discussed.   Prep ordered.  Will proceed with ERAS protocol as well.    The patient verbalized understanding and all questions were answered to the patient's satisfaction.  labs/images/medications/previous chart entries reviewed personally and relevant changes/updates noted above.

## 2024-03-23 ENCOUNTER — Ambulatory Visit
Admission: RE | Admit: 2024-03-23 | Discharge: 2024-03-23 | Disposition: A | Discharge status: Home / Self Care | Source: Ambulatory Visit | Attending: Surgery | Admitting: Surgery

## 2024-03-23 ENCOUNTER — Ambulatory Visit
Admission: RE | Admit: 2024-03-23 | Discharge: 2024-03-23 | Disposition: A | Discharge status: Home / Self Care | Source: Ambulatory Visit | Attending: Radiology | Admitting: Radiology

## 2024-03-23 DIAGNOSIS — K630 Abscess of intestine: Secondary | ICD-10-CM

## 2024-03-23 HISTORY — PX: IR RADIOLOGIST EVAL & MGMT: IMG5224

## 2024-03-23 MED ORDER — IOPAMIDOL (ISOVUE-300) INJECTION 61%
100.0000 mL | Freq: Once | INTRAVENOUS | Status: AC | PRN
Start: 2024-03-23 — End: 2024-03-23
  Administered 2024-03-23: 100 mL via INTRAVENOUS

## 2024-03-23 NOTE — Progress Notes (Incomplete)
 Reason for visit: Drain follow up. Referred by McInnis,Caitlyn M, IR PA   Care Team(s) Primary Care: [facility], [facility]  History of present illness:  [name] is a 63 year old male with a history of diverticulitis for which he was previously hospitalized in early August. At that time he was noted to have a small 2x2x1cm diverticular abscess and IR was consulted for possible drainage; unfortunately given the small size the abscess was not amenable to drainage and the patient was managed conservatively with IV antibiotics and discharge on 8/11. He followed up outpatient with Dr. Tye  on 8/18 at which time he was reportedly doing well. Unfortunately he returned to the ED 9/2 with complaints of fever, chills, lower abdominal pain, and decreased stool output for the previous week. ED workup revealed uptrending temperature to 100.30F, no leukocytosis, and imaging concerning for abscess along the rectosigmoid junction an upper rectum. IR was again consulted and patient underwent left transgluteal drain placement on 9/3 with Dr. Philip.   He returns today for drain clinic follow up and reports decreased drain output. He denies F/C. Review of Systems: A 12-point ROS discussed, and pertinent positives are indicated in the HPI above.  All other systems are negative.   Past Medical History:  Diagnosis Date   Diverticulitis     Past Surgical History:  Procedure Laterality Date   TONSILLECTOMY     VASECTOMY      Allergies: Isosorbide nitrate, Oxycodone, and Oxycodone hcl  Medications: Prior to Admission medications   Medication Sig Start Date End Date Taking? Authorizing Provider  acetaminophen  (TYLENOL ) 325 MG tablet Take 2 tablets (650 mg total) by mouth every 6 (six) hours as needed for mild pain (pain score 1-3) or fever (or Fever >/= 101). 03/10/24   Ponnala, Shruthi, MD  allopurinol  (ZYLOPRIM ) 100 MG tablet Take 200 mg by mouth daily. 12/15/23   [provider]   aspirin  EC 81 MG tablet Take 81 mg by mouth once. 12/15/23   [provider]  atorvastatin  (LIPITOR) 20 MG tablet Take 20 mg by mouth.  TAKE ONE TABLET BY MOUTH AT BEDTIME FOR CHOLESTEROL 12/15/23   [provider]  diltiazem  (DILACOR XR ) 120 MG 24 hr capsule Take 120 mg by mouth.  TAKE ONE CAPSULE BY MOUTH EVERY DAY THIS REPLACES METOPROLOL 12/15/23   [provider]  indomethacin (INDOCIN) 25 MG capsule Take 25 mg by mouth 2 (two) times daily with a meal.    [provider]  sodium chloride  flush 0.9 % SOLN injection Use 10 mLs by Intracatheter route as needed. 03/10/24   McInnis, Caitlyn M, PA-C  terbinafine  (LAMISIL ) 250 MG tablet Take 250 mg by mouth.  TAKE ONE TABLET BY MOUTH EVERY DAY 12/15/23   [provider]  traZODone  (DESYREL ) 100 MG tablet Take 100 mg by mouth.  TAKE ONE TABLET BY MOUTH AT BEDTIME FOR SLEEP 11/22/23   [provider]     Family History  Problem Relation Age of Onset   Congestive Heart Failure Mother     Social History   Socioeconomic History   Marital status: Married    Spouse name: Not on file   Number of children: Not on file   Years of education: Not on file   Highest education level: Not on file  Occupational History   Not on file  Tobacco Use   Smoking status: Never   Smokeless tobacco: Never  Substance and Sexual Activity   Alcohol use: Not Currently  Drug use: Not Currently   Sexual activity: Not Currently  Other Topics Concern   Not on file  Social History Narrative   Not on file   Social Drivers of Health   Financial Resource Strain: Low Risk  (02/21/2024)   Received from [facility]   Overall Financial Resource Strain (CARDIA)    Difficulty of Paying Living Expenses: Not hard at all  Food Insecurity: No Food Insecurity (03/07/2024)   Hunger Vital Sign    Worried About Running Out of Food in the Last Year: Never true    Ran Out of Food in the Last Year: Never true   Transportation Needs: No Transportation Needs (03/07/2024)   PRAPARE - Administrator, Civil Service (Medical): No    Lack of Transportation (Non-Medical): No  Physical Activity: Not on file  Stress: Not on file  Social Connections: Not on file   Vital Signs: There were no vitals taken for this visit.  Physical Exam   General: WN, NAD  CV: RRR on monitor Pulm: normal work of breathing on RA Abd: S, ND, NT MSK: Grossly normal Psych: Appropriate affect.   Imaging:    IR CT Drain Placement, 03/08/24 IMPRESSION: CT-guided placement of a drain in the pelvic abscess.  CT AP, 03/07/24 IMPRESSION:  1. Likely connecting abscesses in the perirectal regions/rectosigmoid junction have just over doubled in volume compared to 02/11/2024.  No results found.  Labs:  CBC: Recent Labs    03/07/24 1303 03/08/24 0352 03/09/24 0413 03/10/24 0449  WBC 10.1 9.6 6.4 6.5  HGB 14.6 12.8* 12.2* 13.1  HCT 43.5 36.4* 35.7* 38.7*  PLT 177 162 142* 171    COAGS: No results for input(s): INR, APTT in the last 8760 hours.  BMP: Recent Labs    02/13/24 0502 03/07/24 1303 03/08/24 0352 03/09/24 0413  NA 138 134* 135 134*  K 3.7 4.1 4.0 4.3  CL 99 98 101 99  CO2 28 26 24 26   GLUCOSE 111* 107* 110* 104*  BUN 10 20 20 18   CALCIUM  8.6* 9.2 8.5* 8.6*  CREATININE 0.99 1.26* 0.94 1.13  GFRNONAA >60 >60 >60 >60    Assessment and Plan:  ***  Electronically Signed:  Thom Hall, MD [facility]   Pager. [phone] Clinic. [phone]  I spent a total of {Established Out-Pt:[id number]} in face to face in clinical consultation, greater than 50% of which was counseling/coordinating care for ***

## 2024-04-11 ENCOUNTER — Encounter: Payer: Self-pay | Admitting: Urgent Care

## 2024-04-11 ENCOUNTER — Other Ambulatory Visit: Payer: Self-pay

## 2024-04-11 ENCOUNTER — Encounter
Admission: RE | Admit: 2024-04-11 | Discharge: 2024-04-11 | Disposition: A | Discharge status: Home / Self Care | Source: Ambulatory Visit | Attending: Surgery | Admitting: Surgery

## 2024-04-11 VITALS — Ht 67.0 in | Wt 170.0 lb

## 2024-04-11 DIAGNOSIS — I10 Essential (primary) hypertension: Secondary | ICD-10-CM

## 2024-04-11 DIAGNOSIS — E7849 Other hyperlipidemia: Secondary | ICD-10-CM

## 2024-04-11 HISTORY — DX: Angina pectoris, unspecified: I20.9

## 2024-04-11 HISTORY — DX: Personal history of other diseases of the circulatory system: Z86.79

## 2024-04-11 HISTORY — DX: Hyperlipidemia, unspecified: E78.5

## 2024-04-11 HISTORY — DX: Essential (primary) hypertension: I10

## 2024-04-11 NOTE — Patient Instructions (Addendum)
 Your procedure is scheduled on: Tuesday 04/18/24 Report to the Registration Desk on the 1st floor of the [facility]. To find out your arrival time, please call [phone] between 1PM - 3PM on: Monday 04/17/24 If your arrival time is 6:00 am, do not arrive before that time as the [facility] entrance doors do not open until 6:00 am.  REMEMBER: Instructions that are not followed completely may result in serious medical risk, up to and including death; or upon the discretion of your surgeon and anesthesiologist your surgery may need to be rescheduled.  Pre operative diet as instructed by Dr Tye  In addition, your doctor has ordered for you to drink the provided:  Ensure Pre-Surgery Clear Carbohydrate Drink - drink 2 bottles of the CLEAR Ensure we provided at bedtime Monday 04/17/24 and 1 bottle of the CLEAR Ensure 2 hours before arrivaing for you surgery on Tuesday 04/18/24   Drinking this carbohydrate drink before surgery helps to reduce insulin resistance and improve patient outcomes. Please complete drinking 2 hours before scheduled arrival time.  One week prior to surgery: Stop Anti-inflammatories (NSAIDS) such as Advil, Aleve, Ibuprofen, Motrin, Naproxen, Naprosyn and Aspirin  based products such as Excedrin, Goody's Powder, BC Powder. Stop Aspirin  as instructed.  You may however, continue to take Tylenol  if needed for pain up until the day of surgery  Stop ANY OVER THE COUNTER supplements and vitamins until after surgery.  Continue taking all of your other prescription medications at bedtime up until the day of surgery.  ON THE DAY OF SURGERY ONLY TAKE THESE MEDICATIONS WITH SIPS OF WATER:  NONE  No Alcohol for 24 hours before or after surgery.  No Smoking including e-cigarettes for 24 hours before surgery.  No chewable tobacco products for at least 6 hours before surgery.  No nicotine patches on the day of surgery.  Do not use any recreational drugs for at least a  week (preferably 2 weeks) before your surgery.  Please be advised that the combination of cocaine and anesthesia may have negative outcomes, up to and including death. If you test positive for cocaine, your surgery will be cancelled.  On the morning of surgery brush your teeth with toothpaste and water, you may rinse your mouth with mouthwash if you wish. Do not swallow any toothpaste or mouthwash.  Use CHG Soap or wipes as directed on instruction sheet.  Do not shave body hair from the neck down 48 hours before surgery.  Do not wear lotions, powders, deodorant, cologne or perfumes.  Do not wear jewelry, make-up, hairpins, clips or nail polish.  For welded (permanent) jewelry: bracelets, anklets, waist bands, etc.  Please have this removed prior to surgery.  If it is not removed, there is a chance that hospital personnel will need to cut it off on the day of surgery.   Contact lenses, hearing aids and dentures may not be worn into surgery.  Do not bring valuables to the hospital. [facility] is not responsible for any missing/lost belongings or valuables.   Notify your doctor [facility]) if there is any change in your medical condition (cold, fever, infection).  Wear comfortable clothing (specific to your surgery type) to the hospital.  After surgery, you can help prevent lung complications by doing breathing exercises.  Take deep breaths and cough every 1-2 hours. Your doctor may order a device called an Incentive Spirometer to help you take deep breaths.  When coughing or sneezing, hold a pillow firmly against your incision with  both hands. This is called "splinting." Doing this helps protect your incision. It also decreases belly discomfort.  If you are being admitted to the hospital overnight, leave your suitcase in the car. After surgery it may be brought to your room.  In case of increased patient census, it may be necessary for you, the patient, to continue your postoperative care  in the Same Day Surgery department.  Please call the Pre-admissions Testing Dept. at [phone] if you have any questions about these instructions.  Surgery Visitation Policy:  Patients having surgery or a procedure may have two visitors.  Children under the age of 59 must have an adult with them who is not the patient.  Inpatient Visitation:    Visiting hours are 7 a.m. to 8 p.m. Up to four visitors are allowed at one time in a patient room. The visitors may rotate out with other people during the day.  One visitor age 58 or older may stay with the patient overnight and must be in the room by 8 p.m.   Merchandiser, retail to address health-related social needs:  [email or web address]                                                                                                              Preparing for Surgery with CHLORHEXIDINE  GLUCONATE (CHG) Soap  Chlorhexidine  Gluconate (CHG) Soap  o An antiseptic cleaner that kills germs and bonds with the skin to continue killing germs even after washing  o Used for showering the night before surgery and morning of surgery  Before surgery, you can play an important role by reducing the number of germs on your skin.  CHG (Chlorhexidine  gluconate) soap is an antiseptic cleanser which kills germs and bonds with the skin to continue killing germs even after washing.  Please do not use if you have an allergy to CHG or antibacterial soaps. If your skin becomes reddened/irritated stop using the CHG.  1. Shower the NIGHT BEFORE SURGERY with CHG soap.  2. If you choose to wash your hair, wash your hair first as usual with your normal shampoo.  3. After shampooing, rinse your hair and body thoroughly to remove the shampoo.  4. Use CHG as you would any other liquid soap. You can apply CHG directly to the skin and wash gently with a clean washcloth.  5. Apply the CHG soap to your body only from the neck down. Do not use  on open wounds or open sores. Avoid contact with your eyes, ears, mouth, and genitals (private parts). Wash face and genitals (private parts) with your normal soap.  6. Wash thoroughly, paying special attention to the area where your surgery will be performed.  7. Thoroughly rinse your body with warm water.  8. Do not shower/wash with your normal soap after using and rinsing off the CHG soap.  9. Do not use lotions, oils, etc., after showering with CHG.  10. Pat yourself dry with a clean towel.  11. Wear clean pajamas to bed the night before  surgery.  12. Place clean sheets on your bed the night of your shower and do not sleep with pets.  13. Do not apply any deodorants/lotions/powders.  14. Please wear clean clothes to the hospital.  15. Remember to brush your teeth with your regular toothpaste.

## 2024-04-12 ENCOUNTER — Encounter
Admission: RE | Admit: 2024-04-12 | Discharge: 2024-04-12 | Disposition: A | Discharge status: Home / Self Care | Source: Ambulatory Visit | Attending: Surgery | Admitting: Surgery

## 2024-04-12 DIAGNOSIS — Z01810 Encounter for preprocedural cardiovascular examination: Secondary | ICD-10-CM | POA: Insufficient documentation

## 2024-04-12 DIAGNOSIS — I10 Essential (primary) hypertension: Secondary | ICD-10-CM | POA: Diagnosis not present

## 2024-04-18 ENCOUNTER — Ambulatory Visit: Admission: RE | Admit: 2024-04-18 | Source: Ambulatory Visit | Admitting: Surgery

## 2024-04-18 ENCOUNTER — Encounter: Admission: RE | Payer: Self-pay | Source: Ambulatory Visit

## 2024-04-18 SURGERY — COLECTOMY, SIGMOID, ROBOT-ASSISTED
Anesthesia: General

## 2024-04-19 ENCOUNTER — Encounter: Payer: Self-pay | Admitting: Surgery

## 2024-04-19 NOTE — Progress Notes (Signed)
 Perioperative / Anesthesia Services  Pre-Admission Testing Clinical Review / Pre-Operative Anesthesia Consult  Date: 04/19/24  PATIENT DEMOGRAPHICS: Name: Erik Carpenter DOB: [birth year] MRN:   [id number]  Note: Available PAT nursing documentation and vital signs have been reviewed. Clinical nursing staff has updated patient's PMH/PSHx, current medication list, and drug allergies/intolerances to ensure complete and comprehensive history available to assist care teams in MDM as it pertains to the aforementioned surgical procedure and anticipated anesthetic course. Extensive review of available clinical information personally performed. Nursing documentation reviewed. Erik Carpenter PMH and PSHx updated with any diagnoses and/or procedures that I have knowledge of that may have been inadvertently omitted during his intake with the pre-admission testing department's nursing staff.  PLANNED SURGICAL PROCEDURE(S):   Case: [id number] Date/Time: 04/25/24 1327   Procedure: COLECTOMY, PARTIAL, ROBOT-ASSISTED, LAPAROSCOPIC (Abdomen)   Anesthesia type: General   Pre-op diagnosis: K57.20 Diverticulitis of large intestine with abscess without bleeding   Location: [facility] OR ROOM 04 / [facility] ORS FOR ANESTHESIA GROUP   Surgeons: Tye Millet, DO        CLINICAL DISCUSSION: Erik Carpenter is a 63 y.o. male who is submitted for pre-surgical anesthesia review and clearance prior to him undergoing the above procedure. Patient has never been a smoker in the past. Pertinent PMH includes: CAD, dysrhythmia, nonspecific chest pain, myocardial bridge, HTN, HLD, GERD (no daily Tx)diverticulitis with abscess, insomnia.  Patient is followed by cardiology Tilman, MD). He was last seen in the cardiology clinic on 12/22/2023; notes reviewed. At the time of his clinic visit, patient doing well overall from a cardiovascular perspective.  Patient complaining of exertional chest pain for years, however but in the preceding few months,  pain had worsened.  Patient now experiencing episodes of chest pain that he rates 10 out of 10 with exertion lasting about 5 minutes and associated with both dyspnea and upper extremity pain. Patient denied any PND, orthopnea, palpitations, significant peripheral edema, weakness, fatigue, vertiginous symptoms, or presyncope/syncope. Patient with a past medical history significant for cardiovascular diagnoses. Documented physical exam was grossly benign, providing no evidence of acute exacerbation and/or decompensation of the patient's known cardiovascular conditions.  Diagnostic LEFT heart catheterization was performed on 09/14/2023.  Study revealed normal left ventricular systolic function and EF.  There was mild single-vessel nonobstructive coronary artery disease with a 40% lesion in the mid LAD.  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  All of the coronary arteries were normal.  Recommendations were made for secondary prevention with aspirin  + statin therapy.  Patient underwent TTE on 10/08/2023 that revealed a normal left ventricular systolic function with an EF of >55%.  There was mild LVH.  There were no regional wall motion abnormalities.  Left ventricular diastolic Doppler parameters were normal.  Right ventricular size and function were normal.  RVSP = 26 mmHg.  Mild tricuspid and pulmonary valve regurgitation.  All transvalvular gradients were noted to be normal providing no evidence suggestive of valvular stenosis.  Patient underwent myocardial perfusion imaging study on 01/03/2024 revealing a normal left ventricular systolic function and EF.  Patient able to be stressed for a total of 10 minutes and 38 seconds allowing for him to achieve 85% of his MPHR and 13.4 METS of activity.  There was no stress induced myocardial ischemia or arrhythmia; no scintigraphic evidence of scar.  Patient with reproducible symptoms during the study, including chest pain, fatigue, and  back pain.  Again, EKG with no diagnostic ST or T wave  changes.  Duke treadmill score +6.5, which was low risk despite the reproduction of nonlimiting chest pain.  Blood pressure well controlled at 98/63 mmHg on currently prescribed CCB (diltiazem ) monotherapy.  Of note, patient previously on beta-blocker therapy using metoprolol.  With that said, he experienced an exacerbation of his chest pain with those titration, therefore medication was discontinued and diltiazem  therapy was initiated. Patient is on atorvastatin  for his HLD diagnosis and ASCVD prevention. Patient is not diabetic. He does not have an OSAH diagnosis. Patient is able to complete all of his  ADL/IADLs without cardiovascular limitation.  Per the DASI, patient is able to achieve at least 4 METS of physical activity without experiencing any significant degree of angina/anginal equivalent symptoms. No changes were made to his medication regimen during his visit with cardiology.  Given his ongoing chest pain and episodes of shortness of breath, patient being referred to CVTS to discuss potential surgical options for myocardial bridging. Patient scheduled to follow-up with outpatient cardiology in 3 months or sooner if needed.  Erik Carpenter is scheduled for an elective COLECTOMY, PARTIAL, ROBOT-ASSISTED, LAPAROSCOPIC (Abdomen) on 04/25/2024 with Dr. Henriette Pierre, DO. Given patient's past medical history significant for cardiovascular diagnoses, presurgical cardiac clearance was sought by the PAT team. Per cardiology, this patient is optimized for surgery and may proceed with the planned procedural course with a LOW risk of significant perioperative cardiovascular complications.  In review of the patient's medication reconciliation, it is noted that he is on daily oral antithrombotic therapy. He has been instructed on recommendations for holding his daily low-dose ASA for 7 days prior to his procedure with plans to restart as soon as postoperative  bleeding risk felt to be minimized by his primary attending surgeon. The patient has been instructed that his last dose should be on 04/17/2020.  Patient denies previous perioperative complications with anesthesia in the past. In review his EMR, there are no records available for review pertaining to any anesthetic courses within the [facility] system in the recent past.   MOST RECENT VITAL SIGNS:    04/11/2024   12:26 PM 03/23/2024    9:00 AM 03/10/2024    7:44 AM  Vitals with BMI  Height 5' 7    Weight 170 lbs    BMI 26.62    Systolic  130 114  Diastolic  72 82  Pulse  76 55   PROVIDERS/SPECIALISTS: NOTE: Primary physician provider listed below. Patient may have been seen by APP or partner within same practice.   PROVIDER ROLE / SPECIALTY LAST [facility]ERLEAN Pierre Henriette, DO General Surgery (Surgeon) 03/22/2024  Administration, [facility] Provider 02/17/2024  Lavella Anes, MD Cardiology 12/22/2023   ALLERGIES: Allergies  Allergen Reactions   Oxycodone Itching    CURRENT HOME MEDICATIONS: No current facility-administered medications for this encounter.    allopurinol  (ZYLOPRIM ) 100 MG tablet   aspirin  EC 81 MG tablet   atorvastatin  (LIPITOR) 20 MG tablet   diltiazem  (DILACOR XR ) 120 MG 24 hr capsule   traZODone  (DESYREL ) 100 MG tablet   HISTORY: Past Medical History:  Diagnosis Date   Anginal pain    CAD (coronary artery disease)    Diverticulitis    History of irregular heartbeat    HLD (hyperlipidemia)    Hypertension    Insomnia    a.) uses trazodone  as needed   Long-term use of aspirin  therapy    Myocardial bridge    Nonspecific chest pain    Past Surgical History:  Procedure  Laterality Date   CHOLECYSTECTOMY     CIRCUMCISION, NON-NEWBORN     HERNIA REPAIR     IR RADIOLOGIST EVAL & MGMT  03/23/2024   KNEE ARTHROSCOPY Right    x2   LEFT HEART CATH AND CORONARY ANGIOGRAPHY N/A 09/2023   Procedure: LEFT HEART CATH AND CORONARY ANGIOGRAPHY; Location:  [facility]   TONSILLECTOMY     VASECTOMY     WRIST SURGERY Right    torn ligament   Family History  Problem Relation Age of Onset   Congestive Heart Failure Mother    Social History   Tobacco Use   Smoking status: Never   Smokeless tobacco: Never  Substance Use Topics   Alcohol use: Not Currently   LABS:  Lab Results  Component Value Date   WBC 6.5 03/10/2024   HGB 13.1 03/10/2024   HCT 38.7 (L) 03/10/2024   MCV 89.6 03/10/2024   PLT 171 03/10/2024   Lab Results  Component Value Date   NA 134 (L) 03/09/2024   CL 99 03/09/2024   K 4.3 03/09/2024   CO2 26 03/09/2024   BUN 18 03/09/2024   CREATININE 1.13 03/09/2024   GFRNONAA >60 03/09/2024   CALCIUM  8.6 (L) 03/09/2024   ALBUMIN 3.7 03/07/2024   GLUCOSE 104 (H) 03/09/2024    ECG: Date: 04/12/2024  Time ECG obtained: 0904 AM Rate: 62 bpm Rhythm: normal sinus Axis (leads I and aVF): normal Intervals: PR 156 ms. QRS 84 ms. QTc 428 ms. ST segment and T wave changes: No evidence of acute T wave abnormalities or significant ST segment elevation or depression.  Evidence of a possible, age undetermined, prior infarct:  No Comparison: Similar to previous tracing obtained on 08/20/2023   IMAGING / PROCEDURES: MYOCARDIAL PERFUSION IMAGING STUDY (LEXISCAN) performed on 01/03/2024 Resting BP: 122/72 BP @ peak stress: 162/76 Exercise induced hypotension: no  85% MPHR achieved: yes Max Stage achieved: IV Duration of exercise at max stage: 38sec Duration of total exercise: 38sec Max METS achieved: 13.4 Max HR from tracings: 139 (86% maximum predicted HR)  Ventricular tachycardia as an exercise complication: no Beta blocker in last 48 hrs: no Patient on digoxin: no Reproduced symptoms: yes (specify symptoms: _chest discomfort --> NOT LIMITING Exercise terminated due to:  Primary: back pain Secondary: fatigue. chest discomfort (NOT limiting) Exercise test complications: none Final Interpretation: No  diagnostic ST changes  Duke Treadmill Score = + 6.5 (LOW RISK- even with reproduction of non-limiting chest discomfort)   LEFT HEART CATHETERIZATION AND CORONARY ANGIOGRAPHY performed on 09/14/2023 Normal left ventricular systolic function and EF Mild single-vessel nonobstructive coronary artery disease with 40% lesion in the mid LAD that appeared to be a bridge with an excessive hinge point. All other coronary anatomy normal. Recommendations were for secondary prevention with ASA + statin.  TRANSTHORACIC ECHOCARDIOGRAM performed on 10/08/2023 Ejection Fraction = >55%. There is mild left ventricular hypertrophy. Normal diastolic function The right ventricle is normal in size and function. The left atrial size is normal and the right atrial size is normal.  RVSP = 26 mmHg  IMPRESSION AND PLAN: Erik Carpenter has been referred for pre-anesthesia review and clearance prior to him undergoing the planned anesthetic and procedural courses. Available labs, pertinent testing, and imaging results were personally reviewed by me in preparation for upcoming operative/procedural course. [facility] medical record has been updated following extensive record review and patient interview with PAT staff.   This patient has been appropriately cleared by cardiology with an overall  LOW risk of patient experiencing significant perioperative cardiovascular complications. Based on clinical review performed today (04/19/24), barring any significant acute changes in the patient's overall condition, it is anticipated that he will be able to proceed with the planned surgical intervention. Any acute changes in clinical condition may necessitate his procedure being postponed and/or cancelled. Patient will meet with anesthesia team (MD and/or CRNA) on the day of his procedure for preoperative evaluation/assessment. Questions regarding anesthetic course will be fielded at that time.   Pre-surgical instructions were reviewed  with the patient during his PAT appointment, and questions were fielded to satisfaction by PAT clinical staff. He has been instructed on which medications that he will need to hold prior to surgery, as well as the ones that have been deemed safe/appropriate to take on the day of his procedure. As part of the general education provided by PAT, patient made aware both verbally and in writing, that he would need to abstain from the use of any illegal substances during his perioperative course. He was advised that failure to follow the provided instructions could necessitate case cancellation or result in serious perioperative complications up to and including death. Patient encouraged to contact PAT and/or his surgeon's office to discuss any questions or concerns that may arise prior to surgery; verbalized understanding.   Dorise Pereyra, MSN, APRN, FNP-C, CEN [facility]  Perioperative Services Nurse Practitioner Phone: [phone] Fax: [phone] 04/19/24 12:58 PM  NOTE: This note has been prepared using Dragon dictation software. Despite my best ability to proofread, there is always the potential that unintentional transcriptional errors may still occur from this process.

## 2024-04-25 ENCOUNTER — Inpatient Hospital Stay: Payer: Self-pay | Admitting: Urgent Care

## 2024-04-25 ENCOUNTER — Other Ambulatory Visit: Payer: Self-pay

## 2024-04-25 ENCOUNTER — Encounter
Admission: RE | Disposition: A | Discharge status: Home / Self Care | Payer: Self-pay | Source: Home / Self Care | Attending: Surgery

## 2024-04-25 ENCOUNTER — Encounter: Payer: Self-pay | Admitting: Surgery

## 2024-04-25 ENCOUNTER — Ambulatory Visit: Payer: Self-pay | Admitting: Surgery

## 2024-04-25 ENCOUNTER — Inpatient Hospital Stay
Admission: RE | Admit: 2024-04-25 | Discharge: 2024-05-02 | DRG: 329 | Disposition: A | Discharge status: Home / Self Care | Attending: Surgery | Admitting: Surgery

## 2024-04-25 DIAGNOSIS — K5792 Diverticulitis of intestine, part unspecified, without perforation or abscess without bleeding: Principal | ICD-10-CM | POA: Diagnosis present

## 2024-04-25 DIAGNOSIS — K9189 Other postprocedural complications and disorders of digestive system: Secondary | ICD-10-CM | POA: Diagnosis not present

## 2024-04-25 DIAGNOSIS — K660 Peritoneal adhesions (postprocedural) (postinfection): Secondary | ICD-10-CM | POA: Diagnosis present

## 2024-04-25 DIAGNOSIS — Z8249 Family history of ischemic heart disease and other diseases of the circulatory system: Secondary | ICD-10-CM | POA: Diagnosis not present

## 2024-04-25 DIAGNOSIS — K651 Peritoneal abscess: Secondary | ICD-10-CM | POA: Diagnosis present

## 2024-04-25 DIAGNOSIS — Z888 Allergy status to other drugs, medicaments and biological substances status: Secondary | ICD-10-CM

## 2024-04-25 DIAGNOSIS — G4700 Insomnia, unspecified: Secondary | ICD-10-CM | POA: Diagnosis present

## 2024-04-25 DIAGNOSIS — K567 Ileus, unspecified: Secondary | ICD-10-CM | POA: Diagnosis not present

## 2024-04-25 DIAGNOSIS — I10 Essential (primary) hypertension: Secondary | ICD-10-CM | POA: Diagnosis present

## 2024-04-25 DIAGNOSIS — S0501XA Injury of conjunctiva and corneal abrasion without foreign body, right eye, initial encounter: Secondary | ICD-10-CM | POA: Diagnosis not present

## 2024-04-25 DIAGNOSIS — J9382 Other air leak: Secondary | ICD-10-CM | POA: Diagnosis not present

## 2024-04-25 DIAGNOSIS — Z885 Allergy status to narcotic agent status: Secondary | ICD-10-CM

## 2024-04-25 DIAGNOSIS — I2510 Atherosclerotic heart disease of native coronary artery without angina pectoris: Secondary | ICD-10-CM | POA: Diagnosis present

## 2024-04-25 DIAGNOSIS — E785 Hyperlipidemia, unspecified: Secondary | ICD-10-CM | POA: Diagnosis present

## 2024-04-25 DIAGNOSIS — M109 Gout, unspecified: Secondary | ICD-10-CM | POA: Diagnosis present

## 2024-04-25 DIAGNOSIS — Z9049 Acquired absence of other specified parts of digestive tract: Secondary | ICD-10-CM

## 2024-04-25 DIAGNOSIS — X58XXXA Exposure to other specified factors, initial encounter: Secondary | ICD-10-CM | POA: Diagnosis not present

## 2024-04-25 DIAGNOSIS — K219 Gastro-esophageal reflux disease without esophagitis: Secondary | ICD-10-CM | POA: Diagnosis present

## 2024-04-25 DIAGNOSIS — K5720 Diverticulitis of large intestine with perforation and abscess without bleeding: Principal | ICD-10-CM | POA: Diagnosis present

## 2024-04-25 DIAGNOSIS — Z7982 Long term (current) use of aspirin: Secondary | ICD-10-CM

## 2024-04-25 HISTORY — DX: Gastro-esophageal reflux disease without esophagitis: K21.9

## 2024-04-25 HISTORY — DX: Atherosclerotic heart disease of native coronary artery without angina pectoris: I25.10

## 2024-04-25 HISTORY — DX: Chest pain, unspecified: R07.9

## 2024-04-25 HISTORY — PX: COLECTOMY, SIGMOID, ROBOT-ASSISTED: SHX7542

## 2024-04-25 HISTORY — DX: Insomnia, unspecified: G47.00

## 2024-04-25 HISTORY — DX: Long term (current) use of aspirin: Z79.82

## 2024-04-25 HISTORY — PX: ILEOSTOMY: SHX1783

## 2024-04-25 HISTORY — DX: Gout, unspecified: M10.9

## 2024-04-25 HISTORY — DX: Malformation of coronary vessels: Q24.5

## 2024-04-25 SURGERY — COLECTOMY, SIGMOID, ROBOT-ASSISTED
Anesthesia: General | Site: Abdomen

## 2024-04-25 MED ORDER — ALLOPURINOL 100 MG PO TABS
200.0000 mg | ORAL_TABLET | Freq: Every day | ORAL | Status: DC
  Administered 2024-04-25 – 2024-04-26 (×2): 200 mg via ORAL
  Filled 2024-04-25 (×3): qty 2

## 2024-04-25 MED ORDER — INDIGOTINDISULFONATE SODIUM 8 MG/ML IJ SOLN
INTRAMUSCULAR | Status: DC | PRN
  Administered 2024-04-25 (×2): 5 mg via INTRAVENOUS

## 2024-04-25 MED ORDER — SODIUM CHLORIDE 0.9 % IV SOLN
INTRAVENOUS | Status: AC
  Filled 2024-04-25: qty 2

## 2024-04-25 MED ORDER — DROPERIDOL 2.5 MG/ML IJ SOLN: 0.6250 mg | Freq: Once | INTRAMUSCULAR | Status: DC | PRN

## 2024-04-25 MED ORDER — TRAZODONE HCL 100 MG PO TABS
100.0000 mg | ORAL_TABLET | Freq: Every day | ORAL | Status: DC
  Administered 2024-04-25 – 2024-04-26 (×2): 100 mg via ORAL
  Filled 2024-04-25 (×3): qty 1

## 2024-04-25 MED ORDER — DEXAMETHASONE SOD PHOSPHATE PF 10 MG/ML IJ SOLN
INTRAMUSCULAR | Status: DC | PRN
  Administered 2024-04-25: 10 mg via INTRAVENOUS

## 2024-04-25 MED ORDER — PHENYLEPHRINE 80 MCG/ML (10ML) SYRINGE FOR IV PUSH (FOR BLOOD PRESSURE SUPPORT)
PREFILLED_SYRINGE | INTRAVENOUS | Status: DC | PRN
  Administered 2024-04-25 (×5): 160 ug via INTRAVENOUS

## 2024-04-25 MED ORDER — CHLORHEXIDINE GLUCONATE 0.12 % MT SOLN
15.0000 mL | Freq: Once | OROMUCOSAL | Status: AC
  Administered 2024-04-25: 15 mL via OROMUCOSAL

## 2024-04-25 MED ORDER — ONDANSETRON HCL 4 MG/2ML IJ SOLN
4.0000 mg | Freq: Four times a day (QID) | INTRAMUSCULAR | Status: DC | PRN
  Administered 2024-04-27 – 2024-04-28 (×4): 4 mg via INTRAVENOUS
  Filled 2024-04-25 (×6): qty 2

## 2024-04-25 MED ORDER — HYDROCODONE-ACETAMINOPHEN 5-325 MG PO TABS
1.0000 | ORAL_TABLET | Freq: Four times a day (QID) | ORAL | Status: DC | PRN
  Administered 2024-04-26 – 2024-04-27 (×2): 1 via ORAL
  Filled 2024-04-25 (×2): qty 1

## 2024-04-25 MED ORDER — PROPOFOL 500 MG/50ML IV EMUL
INTRAVENOUS | Status: DC | PRN
  Administered 2024-04-25: 145 ug/kg/min via INTRAVENOUS

## 2024-04-25 MED ORDER — PROPOFOL 1000 MG/100ML IV EMUL
INTRAVENOUS | Status: AC
  Filled 2024-04-25: qty 100

## 2024-04-25 MED ORDER — CELECOXIB 200 MG PO CAPS
200.0000 mg | ORAL_CAPSULE | ORAL | Status: AC
  Administered 2024-04-25: 200 mg via ORAL

## 2024-04-25 MED ORDER — CELECOXIB 200 MG PO CAPS
200.0000 mg | ORAL_CAPSULE | Freq: Two times a day (BID) | ORAL | Status: DC
  Administered 2024-04-25 – 2024-04-28 (×4): 200 mg via ORAL
  Filled 2024-04-25 (×7): qty 1

## 2024-04-25 MED ORDER — LACTATED RINGERS IV SOLN
INTRAVENOUS | Status: DC
Start: 2024-04-25 — End: 2024-04-25

## 2024-04-25 MED ORDER — SODIUM CHLORIDE 0.9 % IV SOLN
2.0000 g | INTRAVENOUS | Status: AC
  Administered 2024-04-25 (×2): 2 g via INTRAVENOUS

## 2024-04-25 MED ORDER — ACETAMINOPHEN 10 MG/ML IV SOLN
INTRAVENOUS | Status: AC
  Filled 2024-04-25: qty 100

## 2024-04-25 MED ORDER — PROPOFOL 10 MG/ML IV BOLUS
INTRAVENOUS | Status: DC | PRN
  Administered 2024-04-25: 150 mg via INTRAVENOUS

## 2024-04-25 MED ORDER — HYDROMORPHONE HCL 1 MG/ML IJ SOLN
INTRAMUSCULAR | Status: DC | PRN
  Administered 2024-04-25: 1 mg via INTRAVENOUS

## 2024-04-25 MED ORDER — BUPIVACAINE LIPOSOME 1.3 % IJ SUSP
INTRAMUSCULAR | Status: AC
  Filled 2024-04-25: qty 20

## 2024-04-25 MED ORDER — FENTANYL CITRATE (PF) 100 MCG/2ML IJ SOLN
INTRAMUSCULAR | Status: AC
  Filled 2024-04-25: qty 2

## 2024-04-25 MED ORDER — KETOROLAC TROMETHAMINE 0.5 % OP SOLN
1.0000 [drp] | Freq: Four times a day (QID) | OPHTHALMIC | Status: AC
  Administered 2024-04-25 – 2024-04-26 (×3): 1 [drp] via OPHTHALMIC
  Filled 2024-04-25: qty 3

## 2024-04-25 MED ORDER — SODIUM CHLORIDE (PF) 0.9 % IJ SOLN
INTRAMUSCULAR | Status: AC
  Filled 2024-04-25: qty 50

## 2024-04-25 MED ORDER — CELECOXIB 200 MG PO CAPS
ORAL_CAPSULE | ORAL | Status: AC
  Filled 2024-04-25: qty 1

## 2024-04-25 MED ORDER — LIDOCAINE HCL (CARDIAC) PF 100 MG/5ML IV SOSY
PREFILLED_SYRINGE | INTRAVENOUS | Status: DC | PRN
  Administered 2024-04-25: 100 mg via INTRAVENOUS

## 2024-04-25 MED ORDER — CHLORHEXIDINE GLUCONATE CLOTH 2 % EX PADS
6.0000 | MEDICATED_PAD | Freq: Once | CUTANEOUS | Status: AC
  Administered 2024-04-25: 6 via TOPICAL

## 2024-04-25 MED ORDER — ALBUMIN HUMAN 5 % IV SOLN: INTRAVENOUS | Status: DC | PRN

## 2024-04-25 MED ORDER — SODIUM CHLORIDE 0.9 % IR SOLN
Status: DC | PRN
  Administered 2024-04-25: 1400

## 2024-04-25 MED ORDER — GABAPENTIN 300 MG PO CAPS
300.0000 mg | ORAL_CAPSULE | ORAL | Status: AC
  Administered 2024-04-25: 300 mg via ORAL

## 2024-04-25 MED ORDER — OXYCODONE HCL 5 MG/5ML PO SOLN: 5.0000 mg | Freq: Once | ORAL | Status: AC | PRN

## 2024-04-25 MED ORDER — GLYCOPYRROLATE 0.2 MG/ML IJ SOLN
INTRAMUSCULAR | Status: DC | PRN
  Administered 2024-04-25: .2 mg via INTRAVENOUS

## 2024-04-25 MED ORDER — GABAPENTIN 300 MG PO CAPS
ORAL_CAPSULE | ORAL | Status: AC
  Filled 2024-04-25: qty 1

## 2024-04-25 MED ORDER — BUPIVACAINE-EPINEPHRINE (PF) 0.5% -1:200000 IJ SOLN
INTRAMUSCULAR | Status: AC
  Filled 2024-04-25: qty 30

## 2024-04-25 MED ORDER — ROCURONIUM BROMIDE 100 MG/10ML IV SOLN
INTRAVENOUS | Status: DC | PRN
  Administered 2024-04-25: 50 mg via INTRAVENOUS
  Administered 2024-04-25: 30 mg via INTRAVENOUS
  Administered 2024-04-25 (×2): 50 mg via INTRAVENOUS

## 2024-04-25 MED ORDER — ACETAMINOPHEN 500 MG PO TABS
ORAL_TABLET | ORAL | Status: AC
  Filled 2024-04-25: qty 2

## 2024-04-25 MED ORDER — 0.9 % SODIUM CHLORIDE (POUR BTL) OPTIME
TOPICAL | Status: DC | PRN
  Administered 2024-04-25: 500 mL

## 2024-04-25 MED ORDER — SODIUM CHLORIDE 0.9 % IV SOLN: INTRAVENOUS | Status: AC

## 2024-04-25 MED ORDER — SODIUM CHLORIDE 0.9 % IV SOLN
2.0000 g | INTRAVENOUS | Status: AC
  Administered 2024-04-25 – 2024-04-29 (×5): 2 g via INTRAVENOUS
  Filled 2024-04-25 (×5): qty 20

## 2024-04-25 MED ORDER — MIDAZOLAM HCL (PF) 2 MG/2ML IJ SOLN
INTRAMUSCULAR | Status: DC | PRN
  Administered 2024-04-25: 2 mg via INTRAVENOUS

## 2024-04-25 MED ORDER — CHLORHEXIDINE GLUCONATE 0.12 % MT SOLN
OROMUCOSAL | Status: AC
Start: 2024-04-25 — End: 2024-04-25
  Filled 2024-04-25: qty 15

## 2024-04-25 MED ORDER — SUCCINYLCHOLINE CHLORIDE 200 MG/10ML IV SOSY
PREFILLED_SYRINGE | INTRAVENOUS | Status: DC | PRN
  Administered 2024-04-25: 100 mg via INTRAVENOUS

## 2024-04-25 MED ORDER — KETAMINE HCL 50 MG/5ML IJ SOSY
PREFILLED_SYRINGE | INTRAMUSCULAR | Status: AC
Start: 2024-04-25 — End: 2024-04-25
  Filled 2024-04-25: qty 5

## 2024-04-25 MED ORDER — METRONIDAZOLE 500 MG/100ML IV SOLN
500.0000 mg | Freq: Two times a day (BID) | INTRAVENOUS | Status: AC
  Administered 2024-04-26 – 2024-04-30 (×10): 500 mg via INTRAVENOUS
  Filled 2024-04-25 (×10): qty 100

## 2024-04-25 MED ORDER — FENTANYL CITRATE (PF) 100 MCG/2ML IJ SOLN
25.0000 ug | INTRAMUSCULAR | Status: AC | PRN
  Administered 2024-04-25 (×8): 25 ug via INTRAVENOUS

## 2024-04-25 MED ORDER — VISTASEAL 4 ML SINGLE DOSE KIT
PACK | CUTANEOUS | Status: AC
  Filled 2024-04-25: qty 4

## 2024-04-25 MED ORDER — ACETAMINOPHEN 325 MG PO TABS
650.0000 mg | ORAL_TABLET | Freq: Four times a day (QID) | ORAL | Status: DC | PRN
  Administered 2024-04-26: 650 mg via ORAL
  Filled 2024-04-25 (×2): qty 2

## 2024-04-25 MED ORDER — EPHEDRINE SULFATE-NACL 50-0.9 MG/10ML-% IV SOSY
PREFILLED_SYRINGE | INTRAVENOUS | Status: DC | PRN
  Administered 2024-04-25 (×4): 5 mg via INTRAVENOUS

## 2024-04-25 MED ORDER — ACETAMINOPHEN 10 MG/ML IV SOLN
1000.0000 mg | Freq: Once | INTRAVENOUS | Status: DC | PRN
  Administered 2024-04-25: 1000 mg via INTRAVENOUS

## 2024-04-25 MED ORDER — ORAL CARE MOUTH RINSE: 15.0000 mL | Freq: Once | OROMUCOSAL | Status: AC

## 2024-04-25 MED ORDER — KETAMINE HCL 10 MG/ML IJ SOLN
INTRAMUSCULAR | Status: DC | PRN
  Administered 2024-04-25: 30 mg via INTRAVENOUS
  Administered 2024-04-25: 20 mg via INTRAVENOUS

## 2024-04-25 MED ORDER — FENTANYL CITRATE (PF) 100 MCG/2ML IJ SOLN
INTRAMUSCULAR | Status: DC | PRN
  Administered 2024-04-25 (×2): 50 ug via INTRAVENOUS

## 2024-04-25 MED ORDER — ALBUMIN HUMAN 5 % IV SOLN
INTRAVENOUS | Status: AC
  Filled 2024-04-25: qty 250

## 2024-04-25 MED ORDER — GABAPENTIN 300 MG PO CAPS
300.0000 mg | ORAL_CAPSULE | Freq: Two times a day (BID) | ORAL | Status: DC
  Administered 2024-04-25 – 2024-04-28 (×4): 300 mg via ORAL
  Filled 2024-04-25 (×6): qty 1

## 2024-04-25 MED ORDER — CEFOTETAN DISODIUM 2 G IJ SOLR
INTRAMUSCULAR | Status: AC
  Filled 2024-04-25: qty 2

## 2024-04-25 MED ORDER — PHENYLEPHRINE HCL-NACL 20-0.9 MG/250ML-% IV SOLN
INTRAVENOUS | Status: DC | PRN
Start: 2024-04-25 — End: 2024-04-25
  Administered 2024-04-25: 25 ug/min via INTRAVENOUS

## 2024-04-25 MED ORDER — DEXMEDETOMIDINE HCL IN NACL 200 MCG/50ML IV SOLN
INTRAVENOUS | Status: DC | PRN
  Administered 2024-04-25: 8 ug via INTRAVENOUS
  Administered 2024-04-25: 12 ug via INTRAVENOUS

## 2024-04-25 MED ORDER — ONDANSETRON HCL 4 MG/2ML IJ SOLN
INTRAMUSCULAR | Status: DC | PRN
Start: 2024-04-25 — End: 2024-04-25
  Administered 2024-04-25 (×2): 4 mg via INTRAVENOUS

## 2024-04-25 MED ORDER — OXYCODONE HCL 5 MG PO TABS
5.0000 mg | ORAL_TABLET | Freq: Once | ORAL | Status: AC | PRN
  Administered 2024-04-25: 5 mg via ORAL

## 2024-04-25 MED ORDER — HYDROMORPHONE HCL 1 MG/ML IJ SOLN
0.5000 mg | INTRAMUSCULAR | Status: DC | PRN
  Administered 2024-04-25 – 2024-04-27 (×5): 0.5 mg via INTRAVENOUS
  Filled 2024-04-25 (×5): qty 0.5

## 2024-04-25 MED ORDER — MIDAZOLAM HCL 2 MG/2ML IJ SOLN
INTRAMUSCULAR | Status: AC
  Filled 2024-04-25: qty 2

## 2024-04-25 MED ORDER — BUPIVACAINE-EPINEPHRINE (PF) 0.25% -1:200000 IJ SOLN
INTRAMUSCULAR | Status: DC | PRN
  Administered 2024-04-25: 50 mL via SURGICAL_CAVITY

## 2024-04-25 MED ORDER — DILTIAZEM HCL ER COATED BEADS 120 MG PO CP24
120.0000 mg | ORAL_CAPSULE | Freq: Every day | ORAL | Status: DC
  Administered 2024-04-26 – 2024-05-01 (×5): 120 mg via ORAL
  Filled 2024-04-25 (×6): qty 1

## 2024-04-25 MED ORDER — ONDANSETRON 4 MG PO TBDP: 4.0000 mg | ORAL_TABLET | Freq: Four times a day (QID) | ORAL | Status: DC | PRN

## 2024-04-25 MED ORDER — ACETAMINOPHEN 500 MG PO TABS
1000.0000 mg | ORAL_TABLET | ORAL | Status: AC
  Administered 2024-04-25: 1000 mg via ORAL

## 2024-04-25 MED ORDER — PHENYLEPHRINE HCL-NACL 20-0.9 MG/250ML-% IV SOLN
INTRAVENOUS | Status: AC
  Filled 2024-04-25: qty 250

## 2024-04-25 MED ORDER — BSS IO SOLN
15.0000 mL | Freq: Once | INTRAOCULAR | Status: AC
  Administered 2024-04-25: 15 mL
  Filled 2024-04-25 (×2): qty 15

## 2024-04-25 MED ORDER — BUPIVACAINE-EPINEPHRINE (PF) 0.25% -1:200000 IJ SOLN
INTRAMUSCULAR | Status: AC
  Filled 2024-04-25: qty 30

## 2024-04-25 MED ORDER — TRAMADOL HCL 50 MG PO TABS
50.0000 mg | ORAL_TABLET | Freq: Four times a day (QID) | ORAL | Status: DC | PRN
  Administered 2024-04-26: 50 mg via ORAL
  Filled 2024-04-25: qty 1

## 2024-04-25 MED ORDER — OXYCODONE HCL 5 MG PO TABS
ORAL_TABLET | ORAL | Status: AC
  Filled 2024-04-25: qty 1

## 2024-04-25 MED ORDER — HYDROMORPHONE HCL 1 MG/ML IJ SOLN
INTRAMUSCULAR | Status: AC
  Filled 2024-04-25: qty 1

## 2024-04-25 MED ORDER — PANTOPRAZOLE SODIUM 40 MG IV SOLR
40.0000 mg | Freq: Every day | INTRAVENOUS | Status: DC
  Administered 2024-04-25 – 2024-04-30 (×6): 40 mg via INTRAVENOUS
  Filled 2024-04-25 (×6): qty 10

## 2024-04-25 MED ORDER — SUGAMMADEX SODIUM 200 MG/2ML IV SOLN
INTRAVENOUS | Status: DC | PRN
  Administered 2024-04-25: 200 mg via INTRAVENOUS

## 2024-04-25 SURGICAL SUPPLY — 80 items
BASIN KIT SINGLE STR (MISCELLANEOUS) ×2 IMPLANT
BLADE CLIPPER SURG (BLADE) ×2 IMPLANT
CANNULA CAP OBTURATR AIRSEAL 8 (CAP) ×2 IMPLANT
CATH ROBINSON RED A/P 12FR (CATHETERS) IMPLANT
COVER TIP SHEARS 8 DVNC (MISCELLANEOUS) ×2 IMPLANT
DEFOGGER SCOPE WARM SEASHARP (MISCELLANEOUS) ×2 IMPLANT
DERMABOND ADVANCED .7 DNX12 (GAUZE/BANDAGES/DRESSINGS) IMPLANT
DRAIN CHANNEL JP 15F RND 3/16 (MISCELLANEOUS) IMPLANT
DRAPE ARM DVNC X/XI (DISPOSABLE) ×8 IMPLANT
DRAPE COLUMN DVNC XI (DISPOSABLE) ×2 IMPLANT
DRAPE LEGGINS SURG 28X43 STRL (DRAPES) ×2 IMPLANT
DRAPE UNDER BUTTOCK W/FLU (DRAPES) ×2 IMPLANT
DRSG OPSITE POSTOP 4X10 (GAUZE/BANDAGES/DRESSINGS) IMPLANT
DRSG OPSITE POSTOP 4X8 (GAUZE/BANDAGES/DRESSINGS) IMPLANT
DRSG TEGADERM 4X10 (GAUZE/BANDAGES/DRESSINGS) IMPLANT
ELECTRODE CAUTERY BLDE TIP 2.5 (TIP) IMPLANT
ELECTRODE REM PT RTRN 9FT ADLT (ELECTROSURGICAL) ×2 IMPLANT
EVACUATOR SILICONE 100CC (DRAIN) IMPLANT
FORCEPS BPLR FENES DVNC XI (FORCEP) ×2 IMPLANT
GLOVE BIOGEL PI IND STRL 7.0 (GLOVE) ×6 IMPLANT
GLOVE SURG SYN 6.5 PF PI (GLOVE) ×10 IMPLANT
GOWN STRL REUS W/ TWL LRG LVL3 (GOWN DISPOSABLE) ×14 IMPLANT
GRASPER SUT TROCAR 14GX15 (MISCELLANEOUS) IMPLANT
GRASPER TIP-UP FEN DVNC XI (INSTRUMENTS) ×2 IMPLANT
HANDLE SUCTION POOLE (INSTRUMENTS) IMPLANT
HANDLE YANKAUER SUCT BULB TIP (MISCELLANEOUS) ×2 IMPLANT
IRRIGATION STRYKERFLOW (MISCELLANEOUS) IMPLANT
IRRIGATOR SUCT 8 DISP DVNC XI (IRRIGATION / IRRIGATOR) IMPLANT
IV 0.9% NACL 1000 ML (IV SOLUTION) IMPLANT
KIT OSTOMY 2 PC DRNBL 2.25 STR (WOUND CARE) IMPLANT
KIT PINK PAD W/HEAD ARM REST (MISCELLANEOUS) ×2 IMPLANT
KIT TURNOVER CYSTO (KITS) ×2 IMPLANT
LABEL OR SOLS (LABEL) IMPLANT
MANIFOLD NEPTUNE II (INSTRUMENTS) ×2 IMPLANT
NDL DRIVE SUT CUT DVNC (INSTRUMENTS) ×2 IMPLANT
NDL HYPO 22X1.5 SAFETY MO (MISCELLANEOUS) ×2 IMPLANT
NDL INSUFFLATION 14GA 120MM (NEEDLE) ×2 IMPLANT
NEEDLE DRIVE SUT CUT DVNC (INSTRUMENTS) ×2 IMPLANT
NEEDLE HYPO 22X1.5 SAFETY MO (MISCELLANEOUS) ×4 IMPLANT
NEEDLE INSUFFLATION 14GA 120MM (NEEDLE) ×2 IMPLANT
OBTURATOR OPTICALSTD 8 DVNC (TROCAR) ×2 IMPLANT
PACK COLON CLEAN CLOSURE (MISCELLANEOUS) ×2 IMPLANT
PACK LAP CHOLECYSTECTOMY (MISCELLANEOUS) ×2 IMPLANT
RELOAD STAPLE 60 3.5 BLU DVNC (STAPLE) IMPLANT
RELOAD STAPLE 60 4.3 GRN DVNC (STAPLE) IMPLANT
RETRACTOR WND ALEXIS-O 25 LRG (MISCELLANEOUS) IMPLANT
RETRACTOR WOUND ALXS 18CM MED (MISCELLANEOUS) IMPLANT
SCISSORS MNPLR CVD DVNC XI (INSTRUMENTS) ×2 IMPLANT
SEAL UNIV 5-12 XI (MISCELLANEOUS) ×8 IMPLANT
SEALER VESSEL EXT DVNC XI (MISCELLANEOUS) IMPLANT
SET TUBE FILTERED XL AIRSEAL (SET/KITS/TRAYS/PACK) ×2 IMPLANT
SET TUBE SMOKE EVAC HIGH FLOW (TUBING) IMPLANT
SOLUTION ELECTROSURG ANTI STCK (MISCELLANEOUS) ×2 IMPLANT
SOLUTION PREP PVP 2OZ (MISCELLANEOUS) ×2 IMPLANT
SPONGE DRAIN TRACH 4X4 STRL 2S (GAUZE/BANDAGES/DRESSINGS) IMPLANT
SPONGE T-LAP 18X18 ~~LOC~~+RFID (SPONGE) ×2 IMPLANT
STAPLER 60 SUREFORM DVNC (STAPLE) IMPLANT
STAPLER CIRCULAR MANUAL XL 29 (STAPLE) IMPLANT
STAPLER RELOADABLE 65 2-0 SUT (MISCELLANEOUS) IMPLANT
STAPLER SKIN PROX 35W (STAPLE) IMPLANT
SURGILUBE 2OZ TUBE FLIPTOP (MISCELLANEOUS) ×2 IMPLANT
SUT ETHILON 3-0 (SUTURE) IMPLANT
SUT MNCRL AB 4-0 PS2 18 (SUTURE) ×4 IMPLANT
SUT PDS AB 1 CT1 36 (SUTURE) ×4 IMPLANT
SUT SILK 3 0 SH 30 (SUTURE) IMPLANT
SUT STRATA 3-0 15 RB-1 (SUTURE) IMPLANT
SUT STRATA 3-0 SH (SUTURE) IMPLANT
SUT VIC AB 3-0 SH 27X BRD (SUTURE) IMPLANT
SUT VICRYL 3-0 CR8 SH (SUTURE) IMPLANT
SUTURE MNCRL 4-0 27XMF (SUTURE) IMPLANT
SYR 20ML LL LF (SYRINGE) IMPLANT
SYR 30ML LL (SYRINGE) ×4 IMPLANT
SYSTEM LAPSCP GELPORT 120MM (MISCELLANEOUS) IMPLANT
SYSTEM TROCR 1.5-3 SLV ABD GEL (ENDOMECHANICALS) ×2 IMPLANT
SYSTEM WECK SHIELD CLOSURE (TROCAR) IMPLANT
TOWEL OR 17X26 4PK STRL BLUE (TOWEL DISPOSABLE) IMPLANT
TRAP FLUID SMOKE EVACUATOR (MISCELLANEOUS) ×2 IMPLANT
TRAY FOLEY MTR SLVR 16FR STAT (SET/KITS/TRAYS/PACK) ×2 IMPLANT
TROCAR Z-THREAD FIOS 5X100MM (TROCAR) IMPLANT
WATER STERILE IRR 500ML POUR (IV SOLUTION) ×2 IMPLANT

## 2024-04-25 NOTE — Anesthesia Procedure Notes (Signed)
 Procedure Name: Intubation Date/Time: 04/25/2024 12:07 PM  Performed by: Ledora Duncan, CRNAPre-anesthesia Checklist: Patient identified, Emergency Drugs available, Suction available and Patient being monitored Patient Re-evaluated:Patient Re-evaluated prior to induction Oxygen Delivery Method: Circle system utilized Preoxygenation: Pre-oxygenation with 100% oxygen Induction Type: IV induction, Rapid sequence and Cricoid Pressure applied Laryngoscope Size: McGrath and 3 Grade View: Grade I Tube type: Oral Tube size: 7.0 mm Number of attempts: 1 Airway Equipment and Method: Stylet Placement Confirmation: ETT inserted through vocal cords under direct vision, positive ETCO2 and breath sounds checked- equal and bilateral Secured at: 21 cm Tube secured with: Tape Dental Injury: Teeth and Oropharynx as per pre-operative assessment

## 2024-04-25 NOTE — Anesthesia Preprocedure Evaluation (Signed)
 Anesthesia Evaluation  Patient identified by MRN, date of birth, ID band Patient awake    Reviewed: Allergy & Precautions, NPO status , Patient's Chart, lab work & pertinent test results  History of Anesthesia Complications Negative for: history of anesthetic complications  Airway Mallampati: III  TM Distance: >3 FB Neck ROM: full    Dental  (+) Chipped   Pulmonary neg pulmonary ROS   Pulmonary exam normal        Cardiovascular hypertension, On Medications + angina  + CAD  Normal cardiovascular exam   Diagnostic LEFT heart catheterization was performed on 09/14/2023.  Study revealed normal left ventricular systolic function and EF.  There was mild single-vessel nonobstructive coronary artery disease with a 40% lesion in the mid LAD.  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  All of the coronary arteries were normal.  Recommendations were made for secondary prevention with aspirin  + statin therapy.    Patient underwent TTE on 10/08/2023 that revealed a normal left ventricular systolic function with an EF of >55%.  There was mild LVH.  There were no regional wall motion abnormalities.  Left ventricular diastolic Doppler parameters were normal.  Right ventricular size and function were normal.  RVSP = 26 mmHg.  Mild tricuspid and pulmonary valve regurgitation.  All transvalvular gradients were noted to be normal providing no evidence suggestive of valvular stenosis.    Patient underwent myocardial perfusion imaging study on 01/03/2024 revealing a normal left ventricular systolic function and EF.  Patient able to be stressed for a total of 10 minutes and 38 seconds allowing for him to achieve 85% of his MPHR and 13.4 METS of activity.  There was no stress induced myocardial ischemia or arrhythmia; no scintigraphic evidence of scar.  Patient with reproducible symptoms during the study, including chest pain,  fatigue, and back pain.  Again, EKG with no diagnostic ST or T wave changes.  [facility] treadmill score +6.5, which was low risk despite the reproduction of nonlimiting chest pain.  Cleared by cardiology     Neuro/Psych negative neurological ROS  negative psych ROS   GI/Hepatic Neg liver ROS,GERD  Medicated,,  Endo/Other  negative endocrine ROS    Renal/GU negative Renal ROS  negative genitourinary   Musculoskeletal   Abdominal   Peds  Hematology negative hematology ROS (+)   Anesthesia Other Findings Past Medical History: No date: Anginal pain No date: CAD (coronary artery disease) No date: Diverticulitis No date: GERD (gastroesophageal reflux disease) No date: Gout No date: History of irregular heartbeat No date: HLD (hyperlipidemia) No date: Hypertension No date: Insomnia     Comment:  a.) uses trazodone  as needed No date: Long-term use of aspirin  therapy 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 No date: Nonspecific chest pain  Past Surgical History: No date: CHOLECYSTECTOMY No date: CIRCUMCISION, NON-NEWBORN No date: HERNIA REPAIR 03/23/2024: IR RADIOLOGIST EVAL & MGMT No date: KNEE ARTHROSCOPY; Right     Comment:  x2 09/2023: LEFT HEART CATH AND CORONARY ANGIOGRAPHY; N/A     Comment:  Procedure: LEFT HEART CATH AND CORONARY ANGIOGRAPHY;               Location: [place] No date: TONSILLECTOMY No date: VASECTOMY No date: WRIST SURGERY; Right     Comment:  torn ligament     Reproductive/Obstetrics negative OB ROS  Anesthesia Physical Anesthesia Plan  ASA: 3  Anesthesia Plan: General ETT   Post-op Pain Management: Toradol  IV (intra-op)*, Ofirmev  IV (intra-op)* and Dilaudid  IV   Induction: Intravenous  PONV Risk Score and Plan: 2 and Ondansetron , Dexamethasone, Midazolam  and Treatment may vary due to age or medical condition  Airway Management Planned: Oral ETT  Additional Equipment:   Intra-op Plan:    Post-operative Plan: Extubation in OR  Informed Consent: I have reviewed the patients History and Physical, chart, labs and discussed the procedure including the risks, benefits and alternatives for the proposed anesthesia with the patient or authorized representative who has indicated his/her understanding and acceptance.     Dental Advisory Given  Plan Discussed with: Anesthesiologist, CRNA and Surgeon  Anesthesia Plan Comments: (Patient consented for risks of anesthesia including but not limited to:  - adverse reactions to medications - damage to eyes, teeth, lips or other oral mucosa - nerve damage due to positioning  - sore throat or hoarseness - Damage to heart, brain, nerves, lungs, other parts of body or loss of life  Patient voiced understanding and assent.)         Anesthesia Quick Evaluation

## 2024-04-25 NOTE — H&P (Signed)
 Subjective:   CC: Diverticulitis of large intestine with abscess without bleeding [K57.20]  HPI: returns for evaluation of above.  History of Present Illness Erik Carpenter is a 63 year old male with recurrent diverticulitis who presents for follow-up regarding surgical management.  He recently had an episode of diverticulitis requiring abscess drainage. The drain has not produced output for four days. A follow-up with interventional radiology is scheduled to assess the drain for potential removal. He has had two episodes of diverticulitis within a month, leading to discussions about surgical intervention to prevent further recurrence. His stools have generally been soft even before these episodes. He is concerned about the cost of the procedure as his wife is the sole income earner and he relies on her insurance.   Past Medical History:  has no past medical history on file.  Past Surgical History:  has no past surgical history on file.  Family History: family history is not on file.  Social History:  reports that he has never smoked. He has never used smokeless tobacco. No history on file for alcohol use and drug use.  Current Medications: has a current medication list which includes the following prescription(s): allopurinol , amoxicillin , aspirin , atorvastatin , diltiazem , indomethacin, and trazodone .  Allergies:  Allergies  Allergen Reactions  . Isosorbide Mononitrate Headache  . Oxycodone Anxiety and Itching    ROS:  A 15 point review of systems was performed and pertinent positives and negatives noted in HPI   Objective:     There were no vitals taken for this visit.  Constitutional :  No distress, cooperative, alert  Lymphatics/Throat:  Supple with no lymphadenopathy  Respiratory:  Clear to auscultation bilaterally  Cardiovascular:  Regular rate and rhythm  Gastrointestinal: Soft, non-tender, non-distended, no organomegaly.  Musculoskeletal: Steady gait and movement   Skin: Cool and moist  Psychiatric: Normal affect, non-agitated, not confused       LABS:  N/a    RADS: N/a  Assessment:      Diverticulitis of large intestine with abscess without bleeding [K57.20]  Plan:     Discussed pathophisiology in depth.  The risk of laparoscopic colon resection surgery includes, but not limited to, recurrence, bleeding, chronic pain, post-op infxn, post-op SBO or ileus, hernias, resection of bowel, re-anastamosis, possible ostomy placement and need for re-operation to address said risks. The risks of general anesthetic, if used, includes MI, CVA, sudden death or even reaction to anesthetic medications also discussed. Alternatives include continued observation.  Benefits include possible symptom relief, preventing further decline in health and possible death.   Typical post-op recovery time of additional days in hospital for observation afterwards also discussed.   Prep ordered.  Will proceed with ERAS protocol as well.    The patient verbalized understanding and all questions were answered to the patient's satisfaction.  labs/images/medications/previous chart entries reviewed personally and relevant changes/updates noted above.

## 2024-04-25 NOTE — Progress Notes (Signed)
 Reason for visit: Drain clinic follow up   Care Team(s) Primary Care; [facility]; Henriette Pierre, DO   History of present illness:  63 year old male with a history of diverticulitis for which he was previously hospitalized in early August. At that time he was noted to have a small 2x2x1cm diverticular abscess and IR was consulted for possible drainage; unfortunately given the small size the abscess was not amenable to drainage and the patient was managed conservatively with IV antibiotics and discharge on 8/11. He followed up outpatient with Dr. Pierre  on 8/18 at which time he was reportedly doing well. Unfortunately he returned to the ED 9/2 with complaints of fever, chills, lower abdominal pain, and decreased stool output for the previous week. ED workup revealed uptrending temperature to 100.4, no leukocytosis, and imaging concerning for abscess along the rectosigmoid junction an upper rectum. IR was again consulted and patient underwent left transgluteal drain placement on 9/3 with Dr. DELENA Balder.   He was discharged on 03/10/24 and returns today for drain clinic evaluation. He reports minimal drain output everyday, <10 mL. He denies F/C.  Review of Systems: A 12 point ROS discussed and pertinent positives are indicated in the HPI above.  All other systems are negative.   Past Medical History:  Diagnosis Date   Anginal pain    CAD (coronary artery disease)    Diverticulitis    GERD (gastroesophageal reflux disease)    Gout    History of irregular heartbeat    HLD (hyperlipidemia)    Hypertension    Insomnia    a.) uses trazodone  as needed   Long-term use of aspirin  therapy    Myocardial bridge    Nonspecific chest pain     Past Surgical History:  Procedure Laterality Date   CHOLECYSTECTOMY     CIRCUMCISION, NON-NEWBORN     HERNIA REPAIR     IR RADIOLOGIST EVAL & MGMT  03/23/2024   KNEE ARTHROSCOPY Right    x2   LEFT HEART CATH AND CORONARY  ANGIOGRAPHY N/A 09/2023   Procedure: LEFT HEART CATH AND CORONARY ANGIOGRAPHY; Location: [place] [facility]   TONSILLECTOMY     VASECTOMY     WRIST SURGERY Right    torn ligament    Allergies: Oxycodone  Medications: Prior to Admission medications   Medication Sig Start Date End Date Taking? Authorizing Provider  allopurinol  (ZYLOPRIM ) 100 MG tablet Take 200 mg by mouth at bedtime. 12/15/23   [provider]  aspirin  EC 81 MG tablet Take 81 mg by mouth at bedtime. 12/15/23   [provider]  atorvastatin  (LIPITOR) 20 MG tablet Take 20 mg by mouth at bedtime. 12/15/23   [provider]  diltiazem  (DILACOR XR ) 120 MG 24 hr capsule Take 120 mg by mouth at bedtime. 12/15/23   [provider]  traZODone  (DESYREL ) 100 MG tablet Take 100 mg by mouth at bedtime. 11/22/23   [provider]     Family History  Problem Relation Age of Onset   Congestive Heart Failure Mother     Social History   Socioeconomic History   Marital status: Married    Spouse name: Not on file   Number of children: Not on file   Years of education: Not on file   Highest education level: Not on file  Occupational History   Not on file  Tobacco Use   Smoking status: Never   Smokeless tobacco: Never  Vaping Use   Vaping status: Never  Used  Substance and Sexual Activity   Alcohol use: Not Currently   Drug use: Not Currently   Sexual activity: Not Currently  Other Topics Concern   Not on file  Social History Narrative   Not on file   Social Drivers of Health   Financial Resource Strain: Low Risk  (02/21/2024)   Received from [facility]   Overall Financial Resource Strain (CARDIA)    Difficulty of Paying Living Expenses: Not hard at all  Food Insecurity: No Food Insecurity (03/07/2024)   Hunger Vital Sign    Worried About Running Out of Food in the Last Year: Never true    Ran Out of Food in the Last Year: Never true  Transportation Needs: No  Transportation Needs (03/07/2024)   PRAPARE - Administrator, Civil Service (Medical): No    Lack of Transportation (Non-Medical): No  Physical Activity: Not on file  Stress: Not on file  Social Connections: Not on file    Vital Signs: There were no vitals taken for this visit.  Physical Exam  General: WN, NAD  CV: RRR on monitor Pulm: normal work of breathing on RA Abd: S, ND, NT. Drain site c/d/i MSK: Grossly normal Psych: Appropriate affect.  Imaging:  IR CT Drain Placement, 03/08/24 IMPRESSION: CT-guided placement of a drain in the pelvic abscess.  CT AP, 03/07/24 IMPRESSION: 1. Likely connecting abscesses in the perirectal regions/rectosigmoid junction have just over doubled in volume compared to 02/11/2024.  Labs:  CBC: Recent Labs    03/07/24 1303 03/08/24 0352 03/09/24 0413 03/10/24 0449  WBC 10.1 9.6 6.4 6.5  HGB 14.6 12.8* 12.2* 13.1  HCT 43.5 36.4* 35.7* 38.7*  PLT 177 162 142* 171    COAGS: No results for input(s): INR, APTT in the last 8760 hours.  BMP: Recent Labs    02/13/24 0502 03/07/24 1303 03/08/24 0352 03/09/24 0413  NA 138 134* 135 134*  K 3.7 4.1 4.0 4.3  CL 99 98 101 99  CO2 28 26 24 26   GLUCOSE 111* 107* 110* 104*  BUN 10 20 20 18   CALCIUM  8.6* 9.2 8.5* 8.6*  CREATININE 0.99 1.26* 0.94 1.13  GFRNONAA >60 >60 >60 >60     Assessment and Plan:  63 y/o M w Hx of perforated diverticulitis s/p VIR drain placement 9/3, discharged from hospital on 03/10/24.  Minimal output reported. ROS negative.  *Drain injection without residual abscess or fistula.  *Drain removed. Pt has surgery follow up scheduled w Dr Tye.   Electronically Signed:  Thom Hall, MD [facility]   Pager. [phone] Clinic. [phone]  I spent a total of 15 Minutes in face to face in clinical consultation, greater than 50% of which was counseling/coordinating care for abscess  drain followup.

## 2024-04-25 NOTE — Transfer of Care (Signed)
 Immediate Anesthesia Transfer of Care Note  Patient: Erik Carpenter  Procedure(s) Performed: COLECTOMY, SIGMOID, ROBOT-ASSISTED (Abdomen) CREATION, ILEOSTOMY (Left: Abdomen)  Patient Location: PACU  Anesthesia Type:General  Level of Consciousness: drowsy  Airway & Oxygen Therapy: Patient Spontanous Breathing and Patient connected to face mask oxygen  Post-op Assessment: Report given to RN  Post vital signs: stable  Last Vitals:  Vitals Value Taken Time  BP 93/55 04/25/24 19:10  Temp    Pulse 81 04/25/24 19:14  Resp    SpO2 100 % 04/25/24 19:14  Vitals shown include unfiled device data.  Last Pain:  Vitals:   04/25/24 1047  TempSrc: Temporal  PainSc: 0-No pain         Complications: No notable events documented.

## 2024-04-25 NOTE — Op Note (Signed)
 Preoperative diagnosis: Diverticulitis sigmoid Postoperative diagnosis: Same  Procedure: Robotic assisted laparoscopic sigmoidectomy.   Anesthesia: GETA   Surgeon: Henriette Pierre Assistant: Rodolph armin Luane Horatio for exposure and bedside assist, as well as EEA anastomosis   Wound Classification: clean contaminated   Specimen: Sigmoid colon   Complications: None   Estimated Blood Loss: 250 mL  Indications:  Please see H&P for further details.     FIndings: 1.  Extensive persistent inflammation in the area of previous abscess site along the sigmoid as well as extensive involvement of the distal sigmoid and portion of the rectum. 2. EEA anastomosis with noted air leak visible at the anterior portion of the anastomosis.  3-0STRATAFIX used for closure in a double layer fashion and repeat air leak test noted no leak. 2.  Thickened and shortened mesentery secondary to chronic inflammation, as well as extensive epiploic appendages making visualization difficult 3.  Adequate hemostasis by end of procedure  Description of procedure: The patient was placed on the operating table in the low lithotomy position, both arms tucked. General anesthesia was induced.  Foley and OG placed. A time-out was completed verifying correct patient, procedure, site, positioning, and implant(s) and/or special equipment prior to beginning this procedure. The abdomen was prepped and draped in the usual sterile fashion.    Veress needle inserted at palmer's point and after two click and saline droptest, insufflation started up to 15mm Hg without issue. Needle removed and 8mm port placed through right abdomen lateral to the umbilicus.  No injuries noted during insertion.  3 additional ports were then placed along the right abdominal wall, one 12mm port in RLQ, two 8 mm ports, 8 cm apart under direct visualization.  Inspection of the sigmoid colon, noted extensive adhesions and inflammation of the colon itself to  lateral wall. Lateral attachments were taken down using sharp dissection.  Additional time taken to clear adhesions of the adjacent small bowel to the lateral wall as well using scissors.  No obvious serosal injury was noted during this time to either colon or small bowel.  The left colon dissection was carried up and around the splenic flexure for further mobilization of colon for anastamosis.  Omental adhesions removed in this area as well. Area proximal to the thickened colon wall was chosen for anvil insertion site. Vessel sealer then used to transect the mesentery from this point down to the rectum.   ICG was infused and adequate circulation was noted at the planned staple lines.  60 mm green load stapler was used to transect proximal end.  Another 60 mm green load stapler was then used to transect the colon at the rectum.  Of note, extensive serosal adhesions noted in the rectum down to the pelvic brim making dissection extremely difficult around this area.  Dissection eventually cleared out portions of healthy looking serosa, where it was deemed okay to proceed with end-to-end anastomosis.    Robot undocked, Careers adviser scrubbed back in.  A periumbilical incision was made and dissection carried down to fascia.  Horizontal incision made, rectus muscle split and posterior sheath cut to enter abdomen.  Wound protector placed and specimen removed from abdomen, pending pathology.  Proximal colon pulled through incision and purse string device used to transect end, and 29mm EEA anvil placed through hole and tied down. Edges cleaned and placed back in abdominal cavity.  Midline incision extended more and wound protector placed with a larger 1 than usual to allow adequate visualization of the rectal stump to  confirm viability of the stump as well as palpation to ensure area appropriate for anastomosis.  The proximal end was also confirmed to not be twisted or under excess tension in preparation for anastomosis.     29 mm dilator confirmed to reach the rectal stump staple line.  Dilator removed and 29 mm EEA stapler introduced through the rectum.  Spike noted to be deployed slightly posterior to the staple line as expected. Anvil attached to stapler directly to stapler and a end to end anastomosis was created, again after confirming no twisting of the proximal mesentery and no tension noted on the proximal colon.    Saline was infused into the pelvis, and a air leak test was performed unfortunately, noted air leak visible at the anterior portion of the anastomosis.  Irrigation suctioned out and 3-0STRATAFIX used for closure in a double layer fashion and repeat air leak test noted no leak. all saline was then suctioned out, anastomosis looked viable, and hemostasis was noted throughout the abdomen by this time.  Vistaseal infused around the staple line for additional security.  15 French round drain placed within pelvis around the anastomosis and pulled through the right lower quadrant port site and secured to skin using 3-0 nylon.  Due to the extensive inflammation encountered as well as the positive leak test requiring repair, decision made at this point to proceed with a loop ileostomy to further protect the newly created end-to-end anastomosis.  Verbal consent over phone obtained for proceeding with loop ileostomy creation by wife after explaining the situation.  Additional adhesions of the terminal ileum removed via sharp dissection and preparation for loop ileostomy creation.  With the port sites all on the right side abdominal wall, decision made to proceed with a loop ileostomy creation on the left side to create adequate space and appropriate placement of the loop ileostomy.  Circle of skin removed on the left abdominal wall at the level of the umbilicus and dissection carried down and through the fascia under direct visualization.  Loop of the ileum then pulled through the opening that accommodated 2  fingers.  Clean closure protocol initiated.  Periumbilical incision site closed with 1 PDS x2, both poseterior and anterior fascia.  After infusion of Exparel.  Midline skin then closed with staples.  Remaining port sites closed with 4-0 monocryl.  Loop ileostomy was then matured using interrupted 3-0 Vicryl and after placing a red rubber underneath the loop and securing it into a loop with 3-0 Vicryl.  Ostomy then covered with ostomy appliance.  Port sites dressed with Dermabond.  Skin incision sites covered with honeycomb dressing.  The patient tolerated the procedure well, awakened from anesthesia and was taken to the postanesthesia care unit in satisfactory condition with Foley in place.  Sponge count correct at end of procedure.

## 2024-04-25 NOTE — Interval H&P Note (Addendum)
 No change. Ok to proceed

## 2024-04-25 NOTE — Addendum Note (Signed)
 Encounter addended by: Hughes Simmonds, MD on: 04/25/2024 6:47 AM  Actions taken: Clinical Note Signed

## 2024-04-25 NOTE — H&P (View-Only) (Signed)
 Subjective:   CC: Diverticulitis of large intestine with abscess without bleeding [K57.20]  HPI: returns for evaluation of above.  History of Present Illness Erik Carpenter is a 63 year old male with recurrent diverticulitis who presents for follow-up regarding surgical management.  He recently had an episode of diverticulitis requiring abscess drainage. The drain has not produced output for four days. A follow-up with interventional radiology is scheduled to assess the drain for potential removal. He has had two episodes of diverticulitis within a month, leading to discussions about surgical intervention to prevent further recurrence. His stools have generally been soft even before these episodes. He is concerned about the cost of the procedure as his wife is the sole income earner and he relies on her insurance.   Past Medical History:  has no past medical history on file.  Past Surgical History:  has no past surgical history on file.  Family History: family history is not on file.  Social History:  reports that he has never smoked. He has never used smokeless tobacco. No history on file for alcohol use and drug use.  Current Medications: has a current medication list which includes the following prescription(s): allopurinol , amoxicillin , aspirin , atorvastatin , diltiazem , indomethacin, and trazodone .  Allergies:  Allergies  Allergen Reactions  . Isosorbide Mononitrate Headache  . Oxycodone Anxiety and Itching    ROS:  A 15 point review of systems was performed and pertinent positives and negatives noted in HPI   Objective:     There were no vitals taken for this visit.  Constitutional :  No distress, cooperative, alert  Lymphatics/Throat:  Supple with no lymphadenopathy  Respiratory:  Clear to auscultation bilaterally  Cardiovascular:  Regular rate and rhythm  Gastrointestinal: Soft, non-tender, non-distended, no organomegaly.  Musculoskeletal: Steady gait and movement   Skin: Cool and moist  Psychiatric: Normal affect, non-agitated, not confused       LABS:  N/a    RADS: N/a  Assessment:      Diverticulitis of large intestine with abscess without bleeding [K57.20]  Plan:     Discussed pathophisiology in depth.  The risk of laparoscopic colon resection surgery includes, but not limited to, recurrence, bleeding, chronic pain, post-op infxn, post-op SBO or ileus, hernias, resection of bowel, re-anastamosis, possible ostomy placement and need for re-operation to address said risks. The risks of general anesthetic, if used, includes MI, CVA, sudden death or even reaction to anesthetic medications also discussed. Alternatives include continued observation.  Benefits include possible symptom relief, preventing further decline in health and possible death.   Typical post-op recovery time of additional days in hospital for observation afterwards also discussed.   Prep ordered.  Will proceed with ERAS protocol as well.    The patient verbalized understanding and all questions were answered to the patient's satisfaction.  labs/images/medications/previous chart entries reviewed personally and relevant changes/updates noted above.

## 2024-04-26 ENCOUNTER — Encounter: Payer: Self-pay | Admitting: Surgery

## 2024-04-26 LAB — CBC
HCT: 36.9 % — ABNORMAL LOW (ref 39.0–52.0)
Hemoglobin: 12.6 g/dL — ABNORMAL LOW (ref 13.0–17.0)
MCH: 30.9 pg (ref 26.0–34.0)
MCHC: 34.1 g/dL (ref 30.0–36.0)
MCV: 90.4 fL (ref 80.0–100.0)
Platelets: 184 K/uL (ref 150–400)
RBC: 4.08 MIL/uL — ABNORMAL LOW (ref 4.22–5.81)
RDW: 13.4 % (ref 11.5–15.5)
WBC: 13.2 K/uL — ABNORMAL HIGH (ref 4.0–10.5)
nRBC: 0 % (ref 0.0–0.2)

## 2024-04-26 LAB — BASIC METABOLIC PANEL WITH GFR
Anion gap: 8 (ref 5–15)
BUN: 18 mg/dL (ref 8–23)
CO2: 23 mmol/L (ref 22–32)
Calcium: 8.1 mg/dL — ABNORMAL LOW (ref 8.9–10.3)
Chloride: 106 mmol/L (ref 98–111)
Creatinine, Ser: 1.1 mg/dL (ref 0.61–1.24)
GFR, Estimated: >60 mL/min (ref >60)
Glucose, Bld: 121 mg/dL — ABNORMAL HIGH (ref 70–99)
Potassium: 4.9 mmol/L (ref 3.5–5.1)
Sodium: 137 mmol/L (ref 135–145)

## 2024-04-26 MED ORDER — ERYTHROMYCIN 5 MG/GM OP OINT
TOPICAL_OINTMENT | Freq: Four times a day (QID) | OPHTHALMIC | Status: AC
  Administered 2024-04-26 – 2024-04-30 (×12): 1 via OPHTHALMIC
  Filled 2024-04-26: qty 1

## 2024-04-26 MED ORDER — DEXMEDETOMIDINE HCL IN NACL 80 MCG/20ML IV SOLN
INTRAVENOUS | Status: AC
Start: 2024-04-26 — End: 2024-04-26
  Filled 2024-04-26: qty 20

## 2024-04-26 MED ORDER — PHENYLEPHRINE HCL-NACL 20-0.9 MG/250ML-% IV SOLN
INTRAVENOUS | Status: AC
  Filled 2024-04-26: qty 250

## 2024-04-26 MED ORDER — HYDROMORPHONE HCL 1 MG/ML IJ SOLN
INTRAMUSCULAR | Status: AC
  Filled 2024-04-26: qty 1

## 2024-04-26 MED ORDER — KETAMINE HCL 50 MG/5ML IJ SOSY
PREFILLED_SYRINGE | INTRAMUSCULAR | Status: AC
  Filled 2024-04-26: qty 5

## 2024-04-26 MED ORDER — SODIUM CHLORIDE 0.9 % IV BOLUS
250.0000 mL | Freq: Once | INTRAVENOUS | Status: AC
  Administered 2024-04-26: 250 mL via INTRAVENOUS

## 2024-04-26 MED ORDER — PROPOFOL 1000 MG/100ML IV EMUL
INTRAVENOUS | Status: AC
  Filled 2024-04-26: qty 100

## 2024-04-26 NOTE — Consult Note (Signed)
 Reason for Consult:eye pain following surgery, right eye Referring Physician: Dr. Debby Mines Chief complaint: right eye pain, watering.  HPI: Erik Carpenter is an 63 y.o. male recovering from abdominal surgery who complains of eye pain and watering in the right eye.  His past ocular history is significant only for glasses and contact lens wear.  He reports eye pain and watering and some blurry vision in the right eye since awakening from surgery yesterday.  He reports some improvement, today pain is 6/10 when blinking.    Past Medical History:  Diagnosis Date   Anginal pain    CAD (coronary artery disease)    Diverticulitis    GERD (gastroesophageal reflux disease)    Gout    History of irregular heartbeat    HLD (hyperlipidemia)    Hypertension    Insomnia    a.) uses trazodone  as needed   Long-term use of aspirin  therapy    Myocardial bridge    Nonspecific chest pain     ROS  Past Surgical History:  Procedure Laterality Date   CHOLECYSTECTOMY     CIRCUMCISION, NON-NEWBORN     COLECTOMY, SIGMOID, ROBOT-ASSISTED N/A 04/25/2024   Procedure: COLECTOMY, SIGMOID, ROBOT-ASSISTED;  Surgeon: Tye Millet, DO;  Location: [facility] ORS;  Service: General;  Laterality: N/A;   HERNIA REPAIR     ILEOSTOMY Left 04/25/2024   Procedure: CREATION, ILEOSTOMY;  Surgeon: Tye Millet, DO;  Location: [facility] ORS;  Service: General;  Laterality: Left;   IR RADIOLOGIST EVAL & MGMT  03/23/2024   KNEE ARTHROSCOPY Right    x2   LEFT HEART CATH AND CORONARY ANGIOGRAPHY N/A 09/2023   Procedure: LEFT HEART CATH AND CORONARY ANGIOGRAPHY; Location: [facility]   TONSILLECTOMY     VASECTOMY     WRIST SURGERY Right    torn ligament    Family History  Problem Relation Age of Onset   Congestive Heart Failure Mother     Social History:  reports that he has never smoked. He has never used smokeless tobacco. He reports that he does not currently use alcohol. He reports that he does not currently use  drugs.  Allergies:  Allergies  Allergen Reactions   Oxycodone Itching    Prior to Admission medications   Medication Sig Start Date End Date Taking? Authorizing Provider  allopurinol  (ZYLOPRIM ) 100 MG tablet Take 200 mg by mouth at bedtime. 12/15/23  Yes [provider]  aspirin  EC 81 MG tablet Take 81 mg by mouth at bedtime. 12/15/23  Yes [provider]  atorvastatin  (LIPITOR) 20 MG tablet Take 20 mg by mouth at bedtime. 12/15/23  Yes [provider]  diltiazem  (DILACOR XR ) 120 MG 24 hr capsule Take 120 mg by mouth at bedtime. 12/15/23  Yes [provider]  traZODone  (DESYREL ) 100 MG tablet Take 100 mg by mouth at bedtime. 11/22/23  Yes [provider]    Results for orders placed or performed during the hospital encounter of 04/25/24 (from the past 48 hours)  CBC     Status: Abnormal   Collection Time: 04/26/24  4:53 AM  Result Value Ref Range   WBC 13.2 (H) 4.0 - 10.5 K/uL   RBC 4.08 (L) 4.22 - 5.81 MIL/uL   Hemoglobin 12.6 (L) 13.0 - 17.0 g/dL   HCT 63.0 (L) 60.9 - 47.9 %   MCV 90.4 80.0 - 100.0 fL   MCH 30.9 26.0 - 34.0 pg   MCHC 34.1 30.0 - 36.0 g/dL   RDW 86.5 88.4 -  15.5 %   Platelets 184 150 - 400 K/uL   nRBC 0.0 0.0 - 0.2 %    Comment: Performed at [facility], [address] [place], [place] [address]  Basic metabolic panel     Status: Abnormal   Collection Time: 04/26/24  4:53 AM  Result Value Ref Range   Sodium 137 135 - 145 mmol/L   Potassium 4.9 3.5 - 5.1 mmol/L   Chloride 106 98 - 111 mmol/L   CO2 23 22 - 32 mmol/L   Glucose, Bld 121 (H) 70 - 99 mg/dL    Comment: Glucose reference range applies only to samples taken after fasting for at least 8 hours.   BUN 18 8 - 23 mg/dL   Creatinine, Ser 8.89 0.61 - 1.24 mg/dL   Calcium  8.1 (L) 8.9 - 10.3 mg/dL   GFR, Estimated >39 >39 mL/min    Comment: (NOTE) Calculated using the CKD-EPI Creatinine Equation (2021)    Anion gap 8 5 - 15    Comment: Performed at  [facility], [address] [place], [place] [address]    No results found.  Blood pressure 106/67, pulse 75, temperature (!) 97.5 F (36.4 C), temperature source Oral, resp. rate 16, SpO2 98%.  Mental status: Alert and Oriented x 4  Visual Acuity:  20/30 OD  20/20 near (with bifocals OD, CTL OS)  Pupils:  Equally round/ reactive to light.  No Afferent defect.  Motility:  Full/ orthophoric  Visual Fields:  Full to confrontation  IOP:  soft.  External/ Lids/ Lashes:  Normal  Anterior Segment:  Conjunctiva:  Normal  OU  Cornea:  Heavy tear film, 3x2 mm epi defect just inferior to central visual axis, scallopped edges, no haziness or infiltrates in the right eye.  Left is normal, no staining.   OU  Anterior Chamber: Normal  OU  Lens:   Normal OU  Posterior Segment: Dilated exam deferred    Assessment/Plan: Corneal abrasion/epithelial defect right eye following prolonged abdominal surgery.  Anticipate good healing and improvement over the next 3-5 days without sequelae.  Ok to continue ketorolac  qid OD for pain control.  Also recommend starting erythromycin ophthalmic ointment qid for 4 days.  Ok to discharge with outpatient followup.  Signing off.  Followup with primary eye care provider or [facility] next week.  Please reconsult if patient complains of any worsening.  Adine Novak 04/26/2024, 5:21 PM

## 2024-04-26 NOTE — Consult Note (Signed)
 WOC team consulted for new loop ileostomy placed by Dr. Tye 04/25/2024.    WOC team will follow Wednesday/Thursday for education and support of ostomy.    Thank you,    Powell Bar MSN, RN-BC, [employer]

## 2024-04-26 NOTE — Progress Notes (Signed)
 [facility]- General Surgery  SURGICAL PROGRESS NOTE  Hospital Day(s): 1.   Post op day(s): 1 Day Post-Op.   Interval History:  Patient had low BP of 89/66 and 91/61. Had not received narcotics this morning due to BP. States he's experiencing abdominal pain worse with movement. No nausea or vomiting. Overall patient appeared comfortable on exam.  Recorded drain output was 95 cc.   Vital signs in last 24 hours: [min-max] current  Temp:  [97 F (36.1 C)-98.1 F (36.7 C)] 97.9 F (36.6 C) (10/22 0458) Pulse Rate:  [73-91] 85 (10/22 0458) Resp:  [10-19] 16 (10/22 0458) BP: (89-115)/(55-82) 91/61 (10/22 0458) SpO2:  [94 %-100 %] 94 % (10/22 0458)             Intake/Output last 2 shifts:  10/21 0701 - 10/22 0700 In: 4270 [P.O.:120; I.V.:3400; IV Piggyback:750] Out: 985 [Urine:890; Drains:95]   Physical Exam:  Constitutional: alert, cooperative and no distress  Respiratory: breathing non-labored at rest  Cardiovascular: regular rate and sinus rhythm  Gastrointestinal: soft, tender, and distended, honeycomb dressing intact, low sanguineous output in ileostomy bag and JP drain, other surgical incisions were dry and intact    Labs:     Latest Ref Rng & Units 04/26/2024    4:53 AM 03/10/2024    4:49 AM 03/09/2024    4:13 AM  CBC  WBC 4.0 - 10.5 K/uL 13.2  6.5  6.4   Hemoglobin 13.0 - 17.0 g/dL 87.3  86.8  87.7   Hematocrit 39.0 - 52.0 % 36.9  38.7  35.7   Platelets 150 - 400 K/uL 184  171  142       Latest Ref Rng & Units 04/26/2024    4:53 AM 03/09/2024    4:13 AM 03/08/2024    3:52 AM  CMP  Glucose 70 - 99 mg/dL 878  895  889   BUN 8 - 23 mg/dL 18  18  20    Creatinine 0.61 - 1.24 mg/dL 8.89  8.86  9.05   Sodium 135 - 145 mmol/L 137  134  135   Potassium 3.5 - 5.1 mmol/L 4.9  4.3  4.0   Chloride 98 - 111 mmol/L 106  99  101   CO2 22 - 32 mmol/L 23  26  24    Calcium  8.9 - 10.3 mg/dL 8.1  8.6  8.5     Imaging studies: No new pertinent imaging studies   Assessment/Plan:   63 y.o. male with sigmoid diverticulitis 1 Day Post-Op s/p robotic assisted laparoscopic sigmoidectomy with loop ileostomy, complicated by pertinent comorbidities including essential hypertension and hyperlipidemia.   - Stable this morning with low BP. Ordered NS bolus. Will continue to monitor  - Low sanguineous output in ileostomy and JP drain this morning. Normal Hbg level. Will continue to monitor    - Closely monitor pain level and abdominal distention   - Continue IV flagyl  and rocephin  - Remove foley catheter  - Continue pain management and IV fluids    -- Antonya Leeder Barrientos PA-C

## 2024-04-27 ENCOUNTER — Inpatient Hospital Stay

## 2024-04-27 LAB — BASIC METABOLIC PANEL WITH GFR
Anion gap: 9 (ref 5–15)
BUN: 20 mg/dL (ref 8–23)
CO2: 24 mmol/L (ref 22–32)
Calcium: 8.2 mg/dL — ABNORMAL LOW (ref 8.9–10.3)
Chloride: 103 mmol/L (ref 98–111)
Creatinine, Ser: 0.94 mg/dL (ref 0.61–1.24)
GFR, Estimated: >60 mL/min (ref >60)
Glucose, Bld: 110 mg/dL — ABNORMAL HIGH (ref 70–99)
Potassium: 4.1 mmol/L (ref 3.5–5.1)
Sodium: 136 mmol/L (ref 135–145)

## 2024-04-27 LAB — SURGICAL PATHOLOGY

## 2024-04-27 LAB — CBC
HCT: 39.4 % (ref 39.0–52.0)
Hemoglobin: 12.9 g/dL — ABNORMAL LOW (ref 13.0–17.0)
MCH: 30.6 pg (ref 26.0–34.0)
MCHC: 32.7 g/dL (ref 30.0–36.0)
MCV: 93.6 fL (ref 80.0–100.0)
Platelets: 161 K/uL (ref 150–400)
RBC: 4.21 MIL/uL — ABNORMAL LOW (ref 4.22–5.81)
RDW: 14 % (ref 11.5–15.5)
WBC: 12.3 K/uL — ABNORMAL HIGH (ref 4.0–10.5)
nRBC: 0 % (ref 0.0–0.2)

## 2024-04-27 MED ORDER — ONDANSETRON HCL 4 MG/2ML IJ SOLN
4.0000 mg | Freq: Once | INTRAMUSCULAR | Status: AC
  Administered 2024-04-27: 4 mg via INTRAMUSCULAR

## 2024-04-27 NOTE — Progress Notes (Signed)
 [facility]- General Surgery  SURGICAL PROGRESS NOTE  Hospital Day(s): 2.   Post op day(s): 2 Days Post-Op.   Interval History:  Patient was seen by ophthalmologist yesterday for right eye pain following surgery.  He was advised to continue ketorolac  and was started on erythromycin ophthalmic ointment.  Will follow-up with ophthalmology outpatient. This morning patient reports having an emesis episode. Feels less distended since the episode. Nausea is controlled with antiemetic. Has experienced significant leakage around JP drain. Output is more serous. Overall appeared comfortable on exam. Reports pain is exacerbated with movement, otherwise no pain.    Vital signs in last 24 hours: [min-max] current  Temp:  [97.5 F (36.4 C)-98.8 F (37.1 C)] 98.2 F (36.8 C) (10/23 0746) Pulse Rate:  [69-103] 73 (10/23 0746) Resp:  [16-20] 16 (10/23 0746) BP: (106-128)/(67-87) 114/72 (10/23 0746) SpO2:  [92 %-98 %] 92 % (10/23 0746)             Intake/Output last 2 shifts:  10/22 0701 - 10/23 0700 In: 60 [P.O.:60] Out: 985 [Urine:625; Drains:250; Stool:110]   Physical Exam:  Constitutional: alert, cooperative and no distress  Respiratory: breathing non-labored at rest  Cardiovascular: regular rate and sinus rhythm  Gastrointestinal: soft, tender, and mildly distended, honeycomb dressing intact, changed dressing around JP drain due to leakage. Serous output in JP drain. Low normal appearing output in ileostomy. No gas or stool.   Labs:     Latest Ref Rng & Units 04/27/2024    3:14 AM 04/26/2024    4:53 AM 03/10/2024    4:49 AM  CBC  WBC 4.0 - 10.5 K/uL 12.3  13.2  6.5   Hemoglobin 13.0 - 17.0 g/dL 87.0  87.3  86.8   Hematocrit 39.0 - 52.0 % 39.4  36.9  38.7   Platelets 150 - 400 K/uL 161  184  171       Latest Ref Rng & Units 04/27/2024    3:14 AM 04/26/2024    4:53 AM 03/09/2024    4:13 AM  CMP  Glucose 70 - 99 mg/dL 889  878  895   BUN 8 - 23 mg/dL 20  18  18    Creatinine 0.61  - 1.24 mg/dL 9.05  8.89  8.86   Sodium 135 - 145 mmol/L 136  137  134   Potassium 3.5 - 5.1 mmol/L 4.1  4.9  4.3   Chloride 98 - 111 mmol/L 103  106  99   CO2 22 - 32 mmol/L 24  23  26    Calcium  8.9 - 10.3 mg/dL 8.2  8.1  8.6     Imaging studies: No new pertinent imaging studies   Assessment/Plan:  63 y.o. male with sigmoid diverticulitis 2 Days Post-Op s/p robotic assisted laparoscopic sigmoidectomy with loop ileostomy, complicated by pertinent comorbidities including essential hypertension and hyperlipidemia.    - Patient is likely experiencing a post-operative ileus with having nausea, vomiting and feeling distended. Plan to place NGT on low-intermittent suction.    - Slight improvement with leukocytosis 13.2 >> 12.3  - Normal Hbg levels, continue to monitor    - Continue IV flagyl  and rocephin  - Continue NPO   - Continue to monitor JP drain output  - Continue pain management and antiemetics   -- Kambri Dismore Barrientos PA-C

## 2024-04-27 NOTE — Care Management Important Message (Signed)
 Important Message  Patient Details  Name: Erik Carpenter MRN: [id number] Date of Birth: 01-03-1961   Important Message Given:  Yes - Medicare IM     Rojelio SHAUNNA Rattler 04/27/2024, 1:48 PM

## 2024-04-27 NOTE — Progress Notes (Signed)
 Surgical PA at bedside.  Pt with continued bloating/nausea.  Verbal order obtained to administer another 4mg  of Zofran  at this time.  Will place order into CPOE.

## 2024-04-27 NOTE — Consult Note (Addendum)
 WOC Nurse ostomy follow up Stoma type/location: LLQ, loop ileostomy  Stomal assessment/size: 1 1/2 round, budded with red rubber support rod in place  Peristomal assessment: intact  Treatment options for stomal/peristomal skin: 2 skin barrier Output serous, +flatus Ostomy pouching: 2pc. 2 3/4 with 2 barrier ring Education provided:  Patient and his daughter engaged in learning ostomy care today. Patient's wife work schedule does not allow for her to be present, therefore daughter is taking a video of our visit today  Explained role of ostomy nurse and creation of stoma  Explained stoma characteristics (budded, flush, color, texture, care)  Demonstrated pouch change (cutting new skin barrier, measuring stoma, cleaning peristomal skin and stoma, use of barrier ring) Allowed patient to cut new skin barrier ring; pouch in room for patient and daughter to practice lock and roll closure.   Education on emptying when 1/3 to 1/2 full and how to empty  Demonstrated burping flatus from pouch  Demonstrated use of wick to clean spout   Discussed bathing, diet, gas, medication use,  constipation, diarrhea, dehydration     Provided patient with [employer]  needs items marked prior to DC    Enrolled patient in [place] Secure Start Discharge program: Yes  Explained to patient WOC nursing schedule; will plan to see patient again on Monday for teaching.  Leotha Voeltz [facility], CNS, CWON-AP [phone]

## 2024-04-28 LAB — CBC
HCT: 40.7 % (ref 39.0–52.0)
Hemoglobin: 13.6 g/dL (ref 13.0–17.0)
MCH: 30.4 pg (ref 26.0–34.0)
MCHC: 33.4 g/dL (ref 30.0–36.0)
MCV: 91.1 fL (ref 80.0–100.0)
Platelets: 196 K/uL (ref 150–400)
RBC: 4.47 MIL/uL (ref 4.22–5.81)
RDW: 13.7 % (ref 11.5–15.5)
WBC: 11.7 K/uL — ABNORMAL HIGH (ref 4.0–10.5)
nRBC: 0 % (ref 0.0–0.2)

## 2024-04-28 LAB — BASIC METABOLIC PANEL WITH GFR
Anion gap: 10 (ref 5–15)
BUN: 23 mg/dL (ref 8–23)
CO2: 26 mmol/L (ref 22–32)
Calcium: 8.4 mg/dL — ABNORMAL LOW (ref 8.9–10.3)
Chloride: 102 mmol/L (ref 98–111)
Creatinine, Ser: 0.94 mg/dL (ref 0.61–1.24)
GFR, Estimated: >60 mL/min (ref >60)
Glucose, Bld: 116 mg/dL — ABNORMAL HIGH (ref 70–99)
Potassium: 3.9 mmol/L (ref 3.5–5.1)
Sodium: 138 mmol/L (ref 135–145)

## 2024-04-28 MED ORDER — KETOROLAC TROMETHAMINE 30 MG/ML IJ SOLN
30.0000 mg | Freq: Three times a day (TID) | INTRAMUSCULAR | Status: DC | PRN
  Administered 2024-04-29: 30 mg via INTRAVENOUS
  Filled 2024-04-28: qty 1

## 2024-04-28 MED ORDER — ALLOPURINOL 100 MG PO TABS
200.0000 mg | ORAL_TABLET | Freq: Every day | ORAL | Status: DC
Start: 2024-04-28 — End: 2024-04-29
  Administered 2024-04-28: 200 mg

## 2024-04-28 MED ORDER — CELECOXIB 200 MG PO CAPS
200.0000 mg | ORAL_CAPSULE | Freq: Two times a day (BID) | ORAL | Status: DC
  Administered 2024-04-28: 200 mg
  Filled 2024-04-28 (×2): qty 1

## 2024-04-28 MED ORDER — TRAZODONE HCL 100 MG PO TABS
100.0000 mg | ORAL_TABLET | Freq: Every day | ORAL | Status: DC
  Administered 2024-04-28: 100 mg

## 2024-04-28 MED ORDER — GABAPENTIN 250 MG/5ML PO SOLN
300.0000 mg | Freq: Two times a day (BID) | ORAL | Status: DC
  Administered 2024-04-28: 22:00:00 300 mg
  Filled 2024-04-28 (×3): qty 6

## 2024-04-28 MED ORDER — PHENOL 1.4 % MT LIQD
1.0000 | OROMUCOSAL | Status: DC | PRN
  Filled 2024-04-28: qty 177

## 2024-04-28 NOTE — Progress Notes (Signed)
 [facility]- General Surgery  SURGICAL PROGRESS NOTE  Hospital Day(s): 3.   Post op day(s): 3 Days Post-Op.   Interval History:  Patient was seen and examined. Reports doing much better this morning. Denies any nausea or vomiting. Abdominal pain is better controlled. States he was able to get up and go to the bathroom by himself with minimal discomfort.  Has had high NG output in the last 24 hours.   Vital signs in last 24 hours: [min-max] current  Temp:  [98.3 F (36.8 C)-99.9 F (37.7 C)] 98.4 F (36.9 C) (10/24 0728) Pulse Rate:  [76-86] 76 (10/24 0728) Resp:  [16] 16 (10/24 0728) BP: (122-140)/(79-89) 123/86 (10/24 0728) SpO2:  [93 %-95 %] 94 % (10/24 0728)             Intake/Output last 2 shifts:  10/23 0701 - 10/24 0700 In: 30 [NG/GT:30] Out: 1090 [Emesis/NG output:650; Drains:265; Stool:175]   Physical Exam:  Constitutional: alert, cooperative and no distress  Respiratory: breathing non-labored at rest  Cardiovascular: regular rate and sinus rhythm  Gastrointestinal: soft, tender, and mildly distended, serous output in JP drain and normal output in ileostomy bag, no gas or stool noted.  Labs:     Latest Ref Rng & Units 04/28/2024    5:18 AM 04/27/2024    3:14 AM 04/26/2024    4:53 AM  CBC  WBC 4.0 - 10.5 K/uL 11.7  12.3  13.2   Hemoglobin 13.0 - 17.0 g/dL 86.3  87.0  87.3   Hematocrit 39.0 - 52.0 % 40.7  39.4  36.9   Platelets 150 - 400 K/uL 196  161  184       Latest Ref Rng & Units 04/28/2024    5:18 AM 04/27/2024    3:14 AM 04/26/2024    4:53 AM  CMP  Glucose 70 - 99 mg/dL 883  889  878   BUN 8 - 23 mg/dL 23  20  18    Creatinine 0.61 - 1.24 mg/dL 9.05  9.05  8.89   Sodium 135 - 145 mmol/L 138  136  137   Potassium 3.5 - 5.1 mmol/L 3.9  4.1  4.9   Chloride 98 - 111 mmol/L 102  103  106   CO2 22 - 32 mmol/L 26  24  23    Calcium  8.9 - 10.3 mg/dL 8.4  8.2  8.1     Imaging studies: No new pertinent imaging studies   Assessment/Plan:  63 y.o.  male with sigmoid diverticulitis 3 Days Post-Op s/p robotic assisted laparoscopic sigmoidectomy with loop ileostomy, complicated by pertinent comorbidities including essential hypertension and hyperlipidemia.    - Stable vital signs, no fever not tachycardic with normal BP  - Leukocytosis improving 12.3 >> 11.7, Hbg levels have normalized  - Continue NGT on low-intermittent suction until patient has gas or stool in ileostomy bag. Continue NPO as well  - Continue IV Flagyl  and Rocephin  - Continue pain management and IV fluids  -- Shemuel Harkleroad Barrientos PA-C

## 2024-04-28 NOTE — TOC Initial Note (Signed)
 Transition of Care [facility]) - Initial/Assessment Note    Patient Details  Name: Erik Carpenter MRN: [id number] Date of Birth: 1961/05/04  Transition of [facility]) CM/SW Contact:    Daved JONETTA Hamilton, RN Phone Number: 04/28/2024, 3:48 PM  Clinical Narrative:                  Met with patient. Daughter Rolin and family friend at bedside. Patient verbalized he is independent at baseline. Patient verbalized he lives at home with his spouse and adult daughter Ezra. Patient verbalized he and Ezra feel comfortable with caring for new ileostomy and does not desire any HH assistance at discharge. Patient verbalized Ezra will be transporting him home from the hospital once medically ready for discharge.   If new patient transition needs arise, please place a TOC consult.   Expected Discharge Plan: Home/Self Care     Patient Goals and CMS Choice            Expected Discharge Plan and Services       Living arrangements for the past 2 months: Single Family Home                                      Prior Living Arrangements/Services Living arrangements for the past 2 months: Single Family Home Lives with:: Spouse, Adult Children Patient language and need for interpreter reviewed:: Yes Do you feel safe going back to the place where you live?: Yes      Need for Family Participation in Patient Care: No (Comment) Care giver support system in place?: Yes (comment)   Criminal Activity/Legal Involvement Pertinent to Current Situation/Hospitalization: No - Comment as needed  Activities of Daily Living      Permission Sought/Granted                  Emotional Assessment Appearance:: Appears stated age, Well-Groomed Attitude/Demeanor/Rapport: Engaged Affect (typically observed): Accepting, Appropriate Orientation: : Oriented to Self, Oriented to Place, Oriented to  Time, Oriented to Situation Alcohol / Substance Use: Not Applicable Psych Involvement: No  (comment)  Admission diagnosis:  Diverticulitis [K57.92] Patient Active Problem List   Diagnosis Date Noted   Diverticulitis 04/25/2024   Rectal abscess 03/07/2024   Elevated serum creatinine 03/07/2024   Elevated alkaline phosphatase level 03/07/2024   History of diverticulitis 03/07/2024   Abscess of sigmoid colon 03/07/2024   Essential hypertension 03/07/2024   Hyperlipidemia 03/07/2024   Insomnia 03/07/2024   Gout 03/07/2024   Pain of joint of left ankle and foot 02/12/2024   Acute diverticulitis 02/08/2024   PCP:  Administration, [facility]:   [facility] [address] [place] [address] Phone: [phone] Fax: [phone]  [id number] [place], [place] - [address] AT [facility] [address] [place] [address] Phone: [phone] Fax: [phone]     Social Drivers of Health (SDOH) Social History: SDOH Screenings   Food Insecurity: No Food Insecurity (04/25/2024)  Housing: Low Risk  (04/25/2024)  Transportation Needs: No Transportation Needs (04/25/2024)  Utilities: Not At Risk (04/25/2024)  Financial Resource Strain: Low Risk  (02/21/2024)   Received from [facility]  Tobacco Use: Low Risk  (04/25/2024)   SDOH Interventions:     Readmission Risk Interventions     No data to display

## 2024-04-29 DIAGNOSIS — K5792 Diverticulitis of intestine, part unspecified, without perforation or abscess without bleeding: Secondary | ICD-10-CM

## 2024-04-29 LAB — CBC
HCT: 40.6 % (ref 39.0–52.0)
Hemoglobin: 13.6 g/dL (ref 13.0–17.0)
MCH: 30.7 pg (ref 26.0–34.0)
MCHC: 33.5 g/dL (ref 30.0–36.0)
MCV: 91.6 fL (ref 80.0–100.0)
Platelets: 200 K/uL (ref 150–400)
RBC: 4.43 MIL/uL (ref 4.22–5.81)
RDW: 13.5 % (ref 11.5–15.5)
WBC: 10.4 K/uL (ref 4.0–10.5)
nRBC: 0 % (ref 0.0–0.2)

## 2024-04-29 LAB — BASIC METABOLIC PANEL WITH GFR
Anion gap: 12 (ref 5–15)
BUN: 30 mg/dL — ABNORMAL HIGH (ref 8–23)
CO2: 25 mmol/L (ref 22–32)
Calcium: 8.6 mg/dL — ABNORMAL LOW (ref 8.9–10.3)
Chloride: 101 mmol/L (ref 98–111)
Creatinine, Ser: 1.04 mg/dL (ref 0.61–1.24)
GFR, Estimated: >60 mL/min (ref >60)
Glucose, Bld: 103 mg/dL — ABNORMAL HIGH (ref 70–99)
Potassium: 3.7 mmol/L (ref 3.5–5.1)
Sodium: 138 mmol/L (ref 135–145)

## 2024-04-29 MED ORDER — ALLOPURINOL 100 MG PO TABS
200.0000 mg | ORAL_TABLET | Freq: Every day | ORAL | Status: DC
  Administered 2024-04-29 – 2024-05-01 (×3): 200 mg via ORAL
  Filled 2024-04-29 (×3): qty 2

## 2024-04-29 MED ORDER — CELECOXIB 200 MG PO CAPS
200.0000 mg | ORAL_CAPSULE | Freq: Two times a day (BID) | ORAL | Status: DC
  Administered 2024-04-29 – 2024-05-02 (×6): 200 mg via ORAL
  Filled 2024-04-29 (×6): qty 1

## 2024-04-29 MED ORDER — GABAPENTIN 300 MG PO CAPS
300.0000 mg | ORAL_CAPSULE | Freq: Two times a day (BID) | ORAL | Status: DC
  Administered 2024-04-29 – 2024-05-02 (×7): 300 mg via ORAL
  Filled 2024-04-29 (×7): qty 1

## 2024-04-29 MED ORDER — TRAZODONE HCL 100 MG PO TABS
100.0000 mg | ORAL_TABLET | Freq: Every day | ORAL | Status: DC
  Administered 2024-04-29 – 2024-05-01 (×3): 100 mg via ORAL
  Filled 2024-04-29 (×3): qty 1

## 2024-04-29 NOTE — Progress Notes (Signed)
 04/29/2024  Subjective: Patient is status post robotic assisted sigmoid colectomy with creation of loop ileostomy.  No acute events overnight.  Patient started having ostomy output and his NG tube output has decreased.  He reports feeling hungry.  Denies any worsening abdominal pain.  Vital signs: Temp:  [98 F (36.7 C)-98.4 F (36.9 C)] 98.3 F (36.8 C) (10/25 0734) Pulse Rate:  [78-81] 79 (10/25 0734) Resp:  [14-16] 16 (10/25 0734) BP: (120-137)/(81-86) 120/83 (10/25 0734) SpO2:  [94 %-96 %] 95 % (10/25 0734)   Intake/Output: 10/24 0701 - 10/25 0700 In: -  Out: 1490 [Emesis/NG output:720; Drains:250; Stool:520] Last BM Date : 04/24/24 (pre-OR)  Physical Exam: Constitutional: No acute distress Abdomen: Soft, nondistended, appropriately tender to palpation.  Incisions are clean, dry, intact.  Left lower quadrant loop ileostomy with bridge in place and with liquid stool in the bag.  Labs:  Recent Labs    04/28/24 0518 04/29/24 0257  WBC 11.7* 10.4  HGB 13.6 13.6  HCT 40.7 40.6  PLT 196 200   Recent Labs    04/28/24 0518 04/29/24 0257  NA 138 138  K 3.9 3.7  CL 102 101  CO2 26 25  GLUCOSE 116* 103*  BUN 23 30*  CREATININE 0.94 1.04  CALCIUM  8.4* 8.6*   No results for input(s): LABPROT, INR in the last 72 hours.  Imaging: No results found.  Assessment/Plan: This is a 63 y.o. male status post robotic assisted sigmoidectomy with diverting loop ileostomy.  - Patient has regained bowel function and he is having good ostomy output.  We will clamp his NG this morning and if he is doing well with that with a low residual after, we will be able to DC the NG tube and start him on a clear liquid diet. - Otherwise continue IV antibiotics.  WBC has normalized today so we should be able to stop antibiotics tomorrow. - Out of bed, ambulate as tolerated.   I spent 35 minutes dedicated to the care of this patient on the date of this encounter to include pre-visit review  of records, face-to-face time with the patient discussing diagnosis and management, and any post-visit coordination of care.  Aloysius Sheree Plant, MD [facility]

## 2024-04-30 DIAGNOSIS — K5792 Diverticulitis of intestine, part unspecified, without perforation or abscess without bleeding: Secondary | ICD-10-CM | POA: Diagnosis not present

## 2024-04-30 LAB — BASIC METABOLIC PANEL WITH GFR
Anion gap: 9 (ref 5–15)
BUN: 33 mg/dL — ABNORMAL HIGH (ref 8–23)
CO2: 27 mmol/L (ref 22–32)
Calcium: 8.6 mg/dL — ABNORMAL LOW (ref 8.9–10.3)
Chloride: 103 mmol/L (ref 98–111)
Creatinine, Ser: 0.99 mg/dL (ref 0.61–1.24)
GFR, Estimated: >60 mL/min (ref >60)
Glucose, Bld: 115 mg/dL — ABNORMAL HIGH (ref 70–99)
Potassium: 3.9 mmol/L (ref 3.5–5.1)
Sodium: 139 mmol/L (ref 135–145)

## 2024-04-30 LAB — CBC
HCT: 40.8 % (ref 39.0–52.0)
Hemoglobin: 13.9 g/dL (ref 13.0–17.0)
MCH: 31.2 pg (ref 26.0–34.0)
MCHC: 34.1 g/dL (ref 30.0–36.0)
MCV: 91.5 fL (ref 80.0–100.0)
Platelets: 233 K/uL (ref 150–400)
RBC: 4.46 MIL/uL (ref 4.22–5.81)
RDW: 13.9 % (ref 11.5–15.5)
WBC: 9.6 K/uL (ref 4.0–10.5)
nRBC: 0 % (ref 0.0–0.2)

## 2024-04-30 NOTE — Progress Notes (Signed)
 04/30/2024  Subjective: Patient is status post robotic assisted sigmoid colectomy with creation of loop ileostomy.  No acute events overnight.  Patient's NG tube was removed yesterday and started on clear liquid diet.  Patient reports that he continues to do well and is having good ostomy function with both liquid stool and gas.  He also felt a sensation of passing gas from rectum but did not have any blood or stool itself.  Vital signs: Temp:  [98.1 F (36.7 C)-98.9 F (37.2 C)] 98.1 F (36.7 C) (10/26 0803) Pulse Rate:  [66-80] 66 (10/26 0803) Resp:  [15-18] 18 (10/26 0803) BP: (114-134)/(76-85) 115/76 (10/26 0803) SpO2:  [94 %-96 %] 96 % (10/26 0803) Weight:  [76.2 kg] 76.2 kg (10/26 0500)   Intake/Output: 10/25 0701 - 10/26 0700 In: 930 [P.O.:930] Out: 1140 [Drains:190; Stool:950] Last BM Date : 04/30/24  Physical Exam: Constitutional: No acute distress Abdomen: Soft, nondistended, appropriately tender to palpation.  Incisions are clean, dry, intact.  Left lower quadrant loop ileostomy with bridge in place and with liquid stool in the bag.  Labs:  Recent Labs    04/29/24 0257 04/30/24 0501  WBC 10.4 9.6  HGB 13.6 13.9  HCT 40.6 40.8  PLT 200 233   Recent Labs    04/29/24 0257 04/30/24 0501  NA 138 139  K 3.7 3.9  CL 101 103  CO2 25 27  GLUCOSE 103* 115*  BUN 30* 33*  CREATININE 1.04 0.99  CALCIUM  8.6* 8.6*   No results for input(s): LABPROT, INR in the last 72 hours.  Imaging: No results found.  Assessment/Plan: This is a 63 y.o. male status post robotic assisted sigmoidectomy with diverting loop ileostomy.  - Patient is doing very well from abdominal standpoint and he is having good bowel function and tolerating a clear liquid diet yesterday.  I will advance him to a full liquid diet this morning and a soft diet for dinner. - Out of bed, ambulate as tolerated. - IV antibiotics can be stopped today. - WOC RN will work with patient tomorrow on how to  care for his ostomy.  I spent 35 minutes dedicated to the care of this patient on the date of this encounter to include pre-visit review of records, face-to-face time with the patient discussing diagnosis and management, and any post-visit coordination of care.  Aloysius Sheree Plant, MD [facility]

## 2024-04-30 NOTE — Plan of Care (Signed)

## 2024-04-30 NOTE — Plan of Care (Signed)
   Problem: Education: Goal: Knowledge of General Education information will improve Description: Including pain rating scale, medication(s)/side effects and non-pharmacologic comfort measures Outcome: Progressing   Problem: Clinical Measurements: Goal: Ability to maintain clinical measurements within normal limits will improve Outcome: Progressing Goal: Will remain free from infection Outcome: Progressing

## 2024-05-01 LAB — CBC
HCT: 42.9 % (ref 39.0–52.0)
Hemoglobin: 14.3 g/dL (ref 13.0–17.0)
MCH: 31.1 pg (ref 26.0–34.0)
MCHC: 33.3 g/dL (ref 30.0–36.0)
MCV: 93.3 fL (ref 80.0–100.0)
Platelets: 236 K/uL (ref 150–400)
RBC: 4.6 MIL/uL (ref 4.22–5.81)
RDW: 14.2 % (ref 11.5–15.5)
WBC: 11.8 K/uL — ABNORMAL HIGH (ref 4.0–10.5)
nRBC: 0 % (ref 0.0–0.2)

## 2024-05-01 LAB — BASIC METABOLIC PANEL WITH GFR
Anion gap: 9 (ref 5–15)
BUN: 35 mg/dL — ABNORMAL HIGH (ref 8–23)
CO2: 25 mmol/L (ref 22–32)
Calcium: 8.9 mg/dL (ref 8.9–10.3)
Chloride: 101 mmol/L (ref 98–111)
Creatinine, Ser: 1 mg/dL (ref 0.61–1.24)
GFR, Estimated: >60 mL/min (ref >60)
Glucose, Bld: 126 mg/dL — ABNORMAL HIGH (ref 70–99)
Potassium: 4.3 mmol/L (ref 3.5–5.1)
Sodium: 135 mmol/L (ref 135–145)

## 2024-05-01 MED ORDER — PANTOPRAZOLE SODIUM 40 MG PO TBEC
40.0000 mg | DELAYED_RELEASE_TABLET | Freq: Every day | ORAL | Status: DC
  Administered 2024-05-01: 40 mg via ORAL
  Filled 2024-05-01: qty 1

## 2024-05-01 NOTE — Plan of Care (Signed)

## 2024-05-01 NOTE — Progress Notes (Signed)
 [facility]- General Surgery  SURGICAL PROGRESS NOTE  Hospital Day(s): 6.   Post op day(s): 6 Days Post-Op.   Interval History:  Patient is 6 days s/p robotic assisted sigmoidectomy with diverting loop ileostomy.  He was started on soft diet yesterday for dinner.  States he had homemade soup and a banana for dinner which he tolerated well. Denies any nausea or vomiting.  He did several laps around the nursing station yesterday throughout the day. Says his pain is tolerable this morning.  He reports a leak from ileostomy earlier this morning and had his bag changed. He completed a 5-day course of antibiotic yesterday. WBC is slightly elevated this morning. Patient denies fevers, chills, or any worsening pain. He is expected to follow-up with ostomy nurse this morning.     Vital signs in last 24 hours: [min-max] current  Temp:  [97.7 F (36.5 C)-98.8 F (37.1 C)] 98.2 F (36.8 C) (10/27 0738) Pulse Rate:  [71-80] 71 (10/27 0738) Resp:  [16-18] 18 (10/27 0738) BP: (104-121)/(76-96) 105/78 (10/27 0738) SpO2:  [93 %-96 %] 93 % (10/27 0738)     Height: 5' 7 (170.2 cm) Weight: 76.2 kg BMI (Calculated): 26.31   Intake/Output last 2 shifts:  10/26 0701 - 10/27 0700 In: 1200 [P.O.:1100; IV Piggyback:100] Out: 2345 [Drains:145; Stool:2200]   Physical Exam:  Constitutional: alert, cooperative and no distress  Respiratory: breathing non-labored at rest  Cardiovascular: regular rate and sinus rhythm  Gastrointestinal: soft, non-tender, and mildly distended, stool output in ileostomy and low serous output in JP drain, honeycomb dressing intact  Labs:     Latest Ref Rng & Units 05/01/2024    4:26 AM 04/30/2024    5:01 AM 04/29/2024    2:57 AM  CBC  WBC 4.0 - 10.5 K/uL 11.8  9.6  10.4   Hemoglobin 13.0 - 17.0 g/dL 85.6  86.0  86.3   Hematocrit 39.0 - 52.0 % 42.9  40.8  40.6   Platelets 150 - 400 K/uL 236  233  200       Latest Ref Rng & Units 05/01/2024    4:26 AM 04/30/2024     5:01 AM 04/29/2024    2:57 AM  CMP  Glucose 70 - 99 mg/dL 873  884  896   BUN 8 - 23 mg/dL 35  33  30   Creatinine 0.61 - 1.24 mg/dL 8.99  9.00  8.95   Sodium 135 - 145 mmol/L 135  139  138   Potassium 3.5 - 5.1 mmol/L 4.3  3.9  3.7   Chloride 98 - 111 mmol/L 101  103  101   CO2 22 - 32 mmol/L 25  27  25    Calcium  8.9 - 10.3 mg/dL 8.9  8.6  8.6     Imaging studies: No new pertinent imaging studies   Assessment/Plan:  63 y.o. male with sigmoid diverticulitis  6 Days Post-Op s/p robotic assisted laparoscopic sigmoidectomy with loop ileostomy, complicated by pertinent comorbidities including essential hypertension and hyperlipidemia.    - Stable vital signs, no fever not tachycardic. Will closely monitor leukocytosis.  Patient completed 5-day course of antibiotics yesterday. If leukocytosis continues to trend up, will have low threshold on repeating CT imaging to check for abscess or any postoperative complications. If leukocytosis resolves and pain is still controlled, will consider discharge. Continue soft diet, encouraged small portions.  - Discussed home health with patient. States he is not interested in home health at this time. He feels comfortable  with managing his ileostomy on his own at home. Will follow-up with ostomy nurse this morning. - Encouraged to continue ambulate  -- Adalbert Alberto Barrientos PA-C

## 2024-05-01 NOTE — Consult Note (Signed)
 WOC Nurse ostomy follow up Stoma type/location: LLQ, loop ileostomy  Stomal assessment/size: 40 mm x 32 mm oval with red rubber support rod in place.  Peristomal assessment: intact  Treatment options for stomal/peristomal skin: 2 skin barrier [phone]. Output dark brown liquid stools, 150 ml in bag at 0900. Note: pt had a incident with the full bag this night, needed to change his bag due leaking. Ostomy pouching: 2pc. Flat 2 3/4 #2, bag #649, barrier ring [id number]. Education provided:  Patient and wife engaged with ostomy care instructions. Wife watched by video.  - Explained stoma characteristics; - Discussed in detail difference in flush and protruding stoma and rationale for the use of the different skin barriers; - The pouch need to be change twice per week or if is leaking. - Clean the skin with soup and water, or just water. Keep in mind that any product can stay at the skin before apply the next pouch;   - Cut the barrier as the ostomy size and shape; - Demonstrated measuring stoma, cleaning peristomal skin and stoma, use of barrier ring; Addendum: the barrier ring is moldable, it is possible apply directly around the ostomy, or apply around the barrier cut.  - Take the protect plastic off the barrier. That can be used as a template for the next pouch system. New model left with pt.   - Apply on the skin, stretching a little the lower part of the ostomy to fit correctly. - The warmed skin will interact with the barrier, making it sealed.   - Empty the pouch when becomes 1/3 full or 1/2 full. - Demonstrated how to open and close the lock in roll system. - A bag with filter will provide a release the gas without odor, does not need to protect against the water. - The pt can take a shower normally, the bag is waterproof, including the charcoal filter.  Enrolled patient in [place] Secure Start Discharge program: Yes 10/23.  Marked items in [place] instruments. Provide 5 kits of  supplies to DC.  WOC team will follow THURS. Please reconsult if further assistance is needed. Thank-you,  Lela Holm RN, CNS, [employer], MSN.  (Phone [phone])

## 2024-05-01 NOTE — Progress Notes (Signed)
 PHARMACIST - PHYSICIAN COMMUNICATION CONCERNING: IV to Oral Route Change Policy  RECOMMENDATION: This patient is receiving pantoprazole intravenously. Based on criteria approved by the Pharmacy and Therapeutics Committee, the intravenous medication(s) is/are being converted to the equivalent dose of an oral formulation.   DESCRIPTION: These criteria include: The patient is eating (either orally or via tube) and/or has been taking other orally administered medications for at least 24 hours. The patient has no evidence of active gastrointestinal bleeding or malabsorptive GI state (gastrectomy; short bowel; patient on TNA or NPO).  If you have questions about this conversion, please contact the Pharmacy Department:  [x]   [phone] )  [facility] []   [phone] )  [facility] []   [phone] )  [facility] []   [phone] )  [facility]   Will M. Lenon, PharmD, BCPS Clinical Pharmacist 05/01/2024 2:18 PM

## 2024-05-02 ENCOUNTER — Other Ambulatory Visit: Payer: Self-pay

## 2024-05-02 ENCOUNTER — Inpatient Hospital Stay

## 2024-05-02 LAB — BASIC METABOLIC PANEL WITH GFR
Anion gap: 10 (ref 5–15)
BUN: 35 mg/dL — ABNORMAL HIGH (ref 8–23)
CO2: 26 mmol/L (ref 22–32)
Calcium: 8.9 mg/dL (ref 8.9–10.3)
Chloride: 99 mmol/L (ref 98–111)
Creatinine, Ser: 1.13 mg/dL (ref 0.61–1.24)
GFR, Estimated: >60 mL/min (ref >60)
Glucose, Bld: 126 mg/dL — ABNORMAL HIGH (ref 70–99)
Potassium: 4.1 mmol/L (ref 3.5–5.1)
Sodium: 135 mmol/L (ref 135–145)

## 2024-05-02 LAB — CBC
HCT: 42.8 % (ref 39.0–52.0)
Hemoglobin: 14.6 g/dL (ref 13.0–17.0)
MCH: 30.8 pg (ref 26.0–34.0)
MCHC: 34.1 g/dL (ref 30.0–36.0)
MCV: 90.3 fL (ref 80.0–100.0)
Platelets: 257 K/uL (ref 150–400)
RBC: 4.74 MIL/uL (ref 4.22–5.81)
RDW: 14 % (ref 11.5–15.5)
WBC: 12.4 K/uL — ABNORMAL HIGH (ref 4.0–10.5)
nRBC: 0 % (ref 0.0–0.2)

## 2024-05-02 MED ORDER — LOPERAMIDE HCL 2 MG PO TABS
2.0000 mg | ORAL_TABLET | Freq: Every day | ORAL | 0 refills | Status: AC
  Filled 2024-05-02: qty 12, 12d supply, fill #0

## 2024-05-02 MED ORDER — IOHEXOL 300 MG/ML  SOLN
100.0000 mL | Freq: Once | INTRAMUSCULAR | Status: AC | PRN
  Administered 2024-05-02: 100 mL via INTRAVENOUS

## 2024-05-02 MED ORDER — IOHEXOL 9 MG/ML PO SOLN
500.0000 mL | ORAL | Status: AC
  Administered 2024-05-02 (×2): 500 mL via ORAL

## 2024-05-02 MED ORDER — TRAMADOL HCL 50 MG PO TABS
50.0000 mg | ORAL_TABLET | Freq: Three times a day (TID) | ORAL | 0 refills | Status: AC | PRN
  Filled 2024-05-02: qty 10, 4d supply, fill #0

## 2024-05-02 MED ORDER — IOHEXOL 300 MG/ML  SOLN
25.0000 mL | Freq: Once | INTRAMUSCULAR | Status: AC | PRN
  Administered 2024-05-02: 25 mL

## 2024-05-02 NOTE — Progress Notes (Signed)
 Discharge instructions were reviewed with patient and his wife. Wound care supplies and education was provided. Belongings collected by wife.

## 2024-05-02 NOTE — Progress Notes (Signed)
 [facility]- General Surgery  SURGICAL PROGRESS NOTE  Hospital Day(s): 7.   Post op day(s): 7 Days Post-Op.   Interval History:  Patient is 7 days s/p robotic assisted sigmoidectomy with diverting loop ileostomy.  He continues to tolerate soft diet. States he has some [employer] nuggets and roast beef for dinner yesterday. Admits his abdomen feels tight when ambulating, otherwise no discomfort.  Still having good ileostomy output, its become more solid than liquid.   However, white blood count is trending up.  Vital signs in last 24 hours: [min-max] current  Temp:  [98.5 F (36.9 C)-99.5 F (37.5 C)] 98.5 F (36.9 C) (10/28 0729) Pulse Rate:  [71-79] 71 (10/28 0729) Resp:  [16-18] 17 (10/28 0729) BP: (105-119)/(75-82) 105/75 (10/28 0729) SpO2:  [95 %-97 %] 95 % (10/28 0729)     Height: 5' 7 (170.2 cm) Weight: 76.2 kg BMI (Calculated): 26.31   Intake/Output last 2 shifts:  10/27 0701 - 10/28 0700 In: -  Out: 1255 [Drains:80; Stool:1175]   Physical Exam:  Constitutional: alert, cooperative and no distress  Respiratory: breathing non-labored at rest  Cardiovascular: regular rate and sinus rhythm  Gastrointestinal: soft, TTP on LLQ, and non-distended, mix of solid and liquid stool in ileostomy bag, minimal serous output in JP drain.  Honeycomb dressing intact on midline incision. Laparoscopic incisions are healing well.  Labs:     Latest Ref Rng & Units 05/02/2024    4:28 AM 05/01/2024    4:26 AM 04/30/2024    5:01 AM  CBC  WBC 4.0 - 10.5 K/uL 12.4  11.8  9.6   Hemoglobin 13.0 - 17.0 g/dL 85.3  85.6  86.0   Hematocrit 39.0 - 52.0 % 42.8  42.9  40.8   Platelets 150 - 400 K/uL 257  236  233       Latest Ref Rng & Units 05/02/2024    4:28 AM 05/01/2024    4:26 AM 04/30/2024    5:01 AM  CMP  Glucose 70 - 99 mg/dL 873  873  884   BUN 8 - 23 mg/dL 35  35  33   Creatinine 0.61 - 1.24 mg/dL 8.86  8.99  9.00   Sodium 135 - 145 mmol/L 135  135  139   Potassium 3.5 -  5.1 mmol/L 4.1  4.3  3.9   Chloride 98 - 111 mmol/L 99  101  103   CO2 22 - 32 mmol/L 26  25  27    Calcium  8.9 - 10.3 mg/dL 8.9  8.9  8.6     Imaging studies: No new pertinent imaging studies   Assessment/Plan:  63 y.o. male with sigmoid diverticulitis 7 Days Post-Op s/p robotic assisted laparoscopic sigmoidectomy with loop ileostomy, complicated by pertinent comorbidities including essential hypertension and hyperlipidemia.    - Patient is still presenting with stable vital signs and no worsening symptoms.  Although clinical presentation is reassuring, leukocytosis is trending up. Given patient's history, will repeat CT imaging with IV and oral contrast to check for possible abscess.  Will also do rectal contrast checking for any anastomosis leak.  - If no postop complications are shown on imaging, will consider possible discharge with outpatient antibiotics.  Will reassess plan based on CT findings.  - NPO for imaging  - Encouraged to continue to ambulate  -- Presly Steinruck Barrientos PA-C

## 2024-05-02 NOTE — Discharge Instructions (Addendum)
  Diet: Resume home heart healthy regular diet.   Activity: No heavy lifting >20 pounds (children, pets, laundry, garbage) or strenuous activity until follow-up, but light activity and walking are encouraged. Do not drive or drink alcohol if taking narcotic pain medications.  Wound care: May shower with soapy water and pat dry (do not rub incisions), but no baths or submerging incision underwater until follow-up. (no swimming). Plan to remove skin staples at follow-up. Follow ostomy nurse instructions for ostomy care.  Medications: Resume all home medications. For mild to moderate pain: acetaminophen  (Tylenol ) or ibuprofen (if no kidney disease). Combining Tylenol  with alcohol can substantially increase your risk of causing liver disease. Narcotic pain medications, if prescribed, can be used for severe pain, though may cause nausea, constipation, and drowsiness. Take Tramadol  50 mg as instructed as needed. Take Imodium as instructed.   Call office [phone]) at any time if any questions, worsening pain, fevers/chills, bleeding, drainage from incision site, or other concerns.

## 2024-05-02 NOTE — Discharge Summary (Signed)
 [facility]-General Surgery  SURGICAL DISCHARGE SUMMARY  Patient ID: Erik Carpenter MRN: [id number] DOB/AGE: Apr 04, 1961 63 y.o.  Admit date: 04/25/2024 Discharge date: 05/02/2024  Discharge Diagnoses Patient Active Problem List   Diagnosis Date Noted   Diverticulitis 04/25/2024   Rectal abscess 03/07/2024   Elevated serum creatinine 03/07/2024   Elevated alkaline phosphatase level 03/07/2024   History of diverticulitis 03/07/2024   Abscess of sigmoid colon 03/07/2024   Essential hypertension 03/07/2024   Hyperlipidemia 03/07/2024   Insomnia 03/07/2024   Gout 03/07/2024   Pain of joint of left ankle and foot 02/12/2024   Acute diverticulitis 02/08/2024    Consultants None   Procedures Robotic assisted laparoscopic sigmoidectomy with diverting loop ileostomy    Hospital Course:  Patient was scheduled for a robotic assisted laparoscopic sigmoidectomy on April 25, 2024. During the course of the surgery, there was a positive leak test requiring repair. Dr. Tye proceeded with a diverting loop ileostomy to protect the repaired anastomosis. Patient tolerated procedure well.  Patient started having ostomy output with a decrease in NGT output s/p Day 4. NGT was removed and he was started on clear liquid diet. On s/p Day 5, patient was advanced to soft diet and completed 5-day course of antibiotics.  CT of abdomen and pelvis was obtained s/p Day 7 due to an increase in leukocytosis. No acute findings such as anastomosis leak or abscess were detected on CT imaging. Patient admits to minimal abdominal discomfort. He continues to have good stool output in ileostomy bag and minimal serous output in JP drain. He is tolerating soft diet and ambulating well. Denies any nausea or vomiting. Surgical incisions show no signs of infection. Patient is clear from surgical standpoint.  Patient will follow-up outpatient with Dr. Tye in 2 weeks and repeat labs on May 04, 2024.    Physical  Examination:  Constitutional: alert, in no acute distress Pulmonary: CTA bilaterally, normal breath sounds Cardiac: regular rate and rhythm Gastrointestinal: soft, mildly tender, and non-distended Skin: JP drain was removed. Skin staples on midline incision is clean and dry, no drainage or other signs of infection. Laparoscopic incisions are also clean and dry. Mild reaction to adhesives on right abdomen. Good solid and liquid stool output in ileostomy bag.   Allergies as of 05/02/2024       Reactions   Oxycodone Itching        Medication List     TAKE these medications    allopurinol  100 MG tablet Commonly known as: ZYLOPRIM  Take 200 mg by mouth at bedtime.   aspirin  EC 81 MG tablet Take 81 mg by mouth at bedtime.   atorvastatin  20 MG tablet Commonly known as: LIPITOR Take 20 mg by mouth at bedtime.   diltiazem  120 MG 24 hr capsule Commonly known as: DILACOR XR  Take 120 mg by mouth at bedtime.   loperamide 2 MG tablet Commonly known as: Imodium A-D Take 1 tablet (2 mg total) by mouth daily.   traMADol  50 MG tablet Commonly known as: Ultram  Take 1 tablet (50 mg total) by mouth every 8 (eight) hours as needed for up to 3 days.   traZODone  100 MG tablet Commonly known as: DESYREL  Take 100 mg by mouth at bedtime.          Follow-up Information     Tye, Isami, DO Follow up in 2 week(s).   Specialties: General Surgery, Surgery Why: 2 weeks f/u robotic assisted lap sigmoidectomy with diverting loop ileostomy Contact information: [address]  [place] [address] [phone]                  Time spent on discharge management including discussion of hospital course, clinical condition, outpatient instructions, prescriptions, and follow up with the patient and members of the medical team: >30 minutes  Dael Howland Barrientos PA-C

## 2024-05-03 NOTE — Anesthesia Postprocedure Evaluation (Signed)
 Anesthesia Post Note  Patient: Erik Carpenter  Procedure(s) Performed: COLECTOMY, SIGMOID, ROBOT-ASSISTED (Abdomen) CREATION, ILEOSTOMY (Left: Abdomen)  Patient location during evaluation: PACU Anesthesia Type: General Level of consciousness: awake and alert Pain management: pain level controlled Vital Signs Assessment: post-procedure vital signs reviewed and stable Respiratory status: spontaneous breathing, nonlabored ventilation, respiratory function stable and patient connected to nasal cannula oxygen Cardiovascular status: blood pressure returned to baseline and stable Postop Assessment: no apparent nausea or vomiting Anesthetic complications: no   No notable events documented.   Last Vitals:  Vitals:   05/02/24 0340 05/02/24 0729  BP: 107/82 105/75  Pulse: 76 71  Resp: 16 17  Temp: 37.2 C 36.9 C  SpO2: 97% 95%    Last Pain:  Vitals:   05/02/24 0729  TempSrc: Oral  PainSc: 2                  Lynwood KANDICE Clause

## 2024-08-30 ENCOUNTER — Ambulatory Visit: Admit: 2024-08-30 | Admitting: Surgery
